# Patient Record
Sex: Female | Born: 2000
Health system: Southern US, Community
[De-identification: ages and names within clinical notes are randomized; demographics above are authoritative.]

## PROBLEM LIST (undated history)

## (undated) DIAGNOSIS — J45909 Unspecified asthma, uncomplicated: Secondary | ICD-10-CM

---
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
## 2020-04-20 ENCOUNTER — Other Ambulatory Visit: Payer: Self-pay | Admitting: Psychiatry

## 2020-04-20 DIAGNOSIS — F4010 Social phobia, unspecified: Secondary | ICD-10-CM

## 2020-04-20 DIAGNOSIS — F422 Mixed obsessional thoughts and acts: Secondary | ICD-10-CM

## 2020-04-20 DIAGNOSIS — F341 Dysthymic disorder: Secondary | ICD-10-CM

## 2020-04-23 NOTE — Telephone Encounter (Signed)
Last apt 06/2019 due back at 7 months

## 2020-05-20 ENCOUNTER — Other Ambulatory Visit: Payer: Self-pay | Admitting: Psychiatry

## 2020-05-20 DIAGNOSIS — F4010 Social phobia, unspecified: Secondary | ICD-10-CM

## 2020-05-20 DIAGNOSIS — F341 Dysthymic disorder: Secondary | ICD-10-CM

## 2020-05-20 DIAGNOSIS — F422 Mixed obsessional thoughts and acts: Secondary | ICD-10-CM

## 2020-05-21 NOTE — Telephone Encounter (Signed)
Last apt 11/220  Adm:  Please get patient scheduled for apt

## 2020-06-04 ENCOUNTER — Encounter: Payer: Self-pay | Admitting: Psychiatry

## 2020-06-19 ENCOUNTER — Other Ambulatory Visit: Payer: Self-pay | Admitting: Psychiatry

## 2020-06-19 DIAGNOSIS — F4010 Social phobia, unspecified: Secondary | ICD-10-CM

## 2020-06-19 DIAGNOSIS — F422 Mixed obsessional thoughts and acts: Secondary | ICD-10-CM

## 2020-06-19 DIAGNOSIS — F341 Dysthymic disorder: Secondary | ICD-10-CM

## 2020-06-24 ENCOUNTER — Telehealth: Payer: Self-pay | Admitting: Psychiatry

## 2020-06-24 ENCOUNTER — Encounter: Payer: Self-pay | Admitting: Psychiatry

## 2020-06-24 ENCOUNTER — Telehealth (INDEPENDENT_AMBULATORY_CARE_PROVIDER_SITE_OTHER): Payer: 59 | Admitting: Psychiatry

## 2020-06-24 DIAGNOSIS — H9325 Central auditory processing disorder: Secondary | ICD-10-CM | POA: Diagnosis not present

## 2020-06-24 DIAGNOSIS — F341 Dysthymic disorder: Secondary | ICD-10-CM

## 2020-06-24 DIAGNOSIS — F4010 Social phobia, unspecified: Secondary | ICD-10-CM

## 2020-06-24 DIAGNOSIS — F422 Mixed obsessional thoughts and acts: Secondary | ICD-10-CM | POA: Diagnosis not present

## 2020-06-24 MED ORDER — ESCITALOPRAM OXALATE 10 MG PO TABS: 30.0000 mg | ORAL_TABLET | Freq: Every day | ORAL | 3 refills | Status: DC

## 2020-06-24 MED ORDER — LAMOTRIGINE 100 MG PO TABS: 100.0000 mg | ORAL_TABLET | Freq: Every day | ORAL | 3 refills | Status: DC

## 2020-06-24 NOTE — Telephone Encounter (Signed)
Ms. Rhonda Campos, Rhonda Campos are scheduled for a virtual visit with your provider today.    Just as we do with appointments in the office, we must obtain your consent to participate.  Your consent will be active for this visit and any virtual visit you may have with one of our providers in the next 365 days.    If you have a MyChart account, I can also send a copy of this consent to you electronically.  All virtual visits are billed to your insurance company just like a traditional visit in the office.  As this is a virtual visit, video technology does not allow for your provider to perform a traditional examination.  This may limit your provider's ability to fully assess your condition.  If your provider identifies any concerns that need to be evaluated in person or the need to arrange testing such as labs, EKG, etc, we will make arrangements to do so.    Although advances in technology are sophisticated, we cannot ensure that it will always work on either your end or our end.  If the connection with a video visit is poor, we may have to switch to a telephone visit.  With either a video or telephone visit, we are not always able to ensure that we have a secure connection.   I need to obtain your verbal consent now.   Are you willing to proceed with your visit today?   Rhonda Campos has provided verbal consent on 06/24/2020 for a virtual visit (video or telephone).   Chauncey Mann, MD 06/24/2020  2:08 PM

## 2020-06-24 NOTE — Progress Notes (Signed)
[facility]  Patient ID: Rhonda Campos,  MRN: [id number]  PCP: Maeola Harman, MD  Date of Evaluation: 06/24/2020 Time spent:20 minutes from 1340 to 1400  Chief Complaint:  Chief Complaint    Anxiety; Stress; Depression      HISTORY/CURRENT STATUS: Rhonda Campos is provided telemedicine audiovisual session on MyChart Video Visit platform individually 20 minutes with privacy with telehealth consent with epic collateral video to video for psychiatric interview and exam in 1 year evaluation and management of OCD, atypical dysthymia, and history of social anxiety with CAPD.  Patient had 8 months of medication from last appointment stating she never ran out completely but did run low on her supply and was finally willing to make an appointment.  She completed high school and is now a freshman at [employer] with grades A's and B's taking 17 hours living in the dorm.  She indicates that she had most of her problems addressed while living with parents who do visit and have a good relationship with her.  She concludes the need to continue her medication overall as successful socially and academically.  She has no interim decompensations or medical illness reported, though she does have an IUD now and states she may use some party related alcohol not acknowledging cannabis currently.  She does acknowledge the need to continue her medication and agrees to yearly follow-up.  She understands my imminent retirement and closure of care to transfer to advanced practitioner transition facilitated by reception today at the closure of the appointment.  She has no mania, suicidality, psychosis or delirium.  Depression             The patient presents withdepression as a chronicproblem starting more than 9 years ago. The onset quality was gradual. The problem occurs every several days.The problem has been gradually improvingsince onset.Associated symptoms include decreased  concentration,oversensitivity,social inhibition, obsessional thoughts and acts, decreased interest,and reactive sadness. Associated symptoms include no fatigue,no insomnia, no helplessness,no hopelessness,no irritability, no appetite change,noheadaches,no indigestion, no myalgiasand no suicidal ideas.The symptoms are aggravated by social issues, family issues and work stress.Past treatments include SSRIs - Selective serotonin reuptake inhibitors, other medications and psychotherapy.Compliance with treatment is variable.Past compliance problems include medication issues and difficulty with treatment plan.Previous treatment provided moderaterelief.Risk factors include a change in medication usage/dosage, family history, family history of mental illness, family violence, history of mental illness, major life event and stress. Past medical history includes anxiety,depression,mental health disorderand obsessive-compulsive disorder. Pertinent negatives include no life-threatening condition,no recent psychiatric admission,no bipolar disorder,no eating disorder,no post-traumatic stress disorder,no schizophrenia,no suicide attemptsand no head trauma.  Individual Medical History/ Review of Systems: Changes? :No   Allergies: Red dye  Current Medications:  Current Outpatient Medications:  .  albuterol (PROVENTIL HFA;VENTOLIN HFA) 108 (90 BASE) MCG/ACT inhaler, Inhale into the lungs every 6 (six) hours as needed for wheezing or shortness of breath., Disp: , Rfl:  .  escitalopram (LEXAPRO) 10 MG tablet, Take 3 tablets (30 mg total) by mouth at bedtime., Disp: 270 tablet, Rfl: 3 .  lamoTRIgine (LAMICTAL) 100 MG tablet, Take 1 tablet (100 mg total) by mouth at bedtime., Disp: 90 tablet, Rfl: 3 .  mupirocin ointment (BACTROBAN) 2 %, Apply 1 application topically 2 (two) times daily. To ear, Disp: 30 g, Rfl: 1 .  sulfamethoxazole-trimethoprim (SEPTRA DS) 800-160 MG per tablet,  Take 1 tablet by mouth 2 (two) times daily., Disp: 14 tablet, Rfl: 0  Medication Side Effects: none  Family Medical/ Social History: Changes? No previously noting father  with bipolar and obsessive-compulsive disorder having TBI at age 52 years. Mother may have ADHD and anxiety symptoms. Both parents did poorly on Zoloft and mother did poorly on Cymbalta and Lexapro. Maternal uncle has bipolar disorder as does the uncle's mother who also had psychotic and substance use features.  MENTAL HEALTH EXAM:  There were no vitals taken for this visit.There is no height or weight on file to calculate BMI. Postural reflexes and gait 0/0, and AIMS = 0.  General Appearance: Casual, Meticulous and Well Groomed  Eye Contact:  Good  Speech:  Clear and Coherent, Normal Rate and Talkative  Volume:  Normal  Mood:  Anxious and Euthymic  Affect:  Congruent, Full Range and Anxious  Thought Process:  Coherent, Goal Directed and Descriptions of Associations: Circumstantial  Orientation:  Full (Time, Place, and Person)  Thought Content: Obsessions and Rumination   Suicidal Thoughts:  No  Homicidal Thoughts:  No  Memory:  Immediate;   Good Remote;   Good  Judgement:  Fair  Insight:  Good  Psychomotor Activity:  Normal and Mannerisms  Concentration:  Concentration: Fair and Attention Span: Good  Recall:  Good  Fund of Knowledge: Good  Language: Good  Assets:  Desire for Improvement Resilience Talents/Skills Vocational/Educational  ADL's:  Intact  Cognition: WNL  Prognosis:  Good    DIAGNOSES:    ICD-10-CM   1. Mixed obsessional thoughts and acts  F42.2 escitalopram (LEXAPRO) 10 MG tablet  2. Moderate early onset persistent depressive disorder in partial remission with atypical features and pure persistent depressive syndrome  F34.1 lamoTRIgine (LAMICTAL) 100 MG tablet    escitalopram (LEXAPRO) 10 MG tablet  3. Social anxiety disorder  F40.10 escitalopram (LEXAPRO) 10 MG tablet  4. Central auditory  processing disorder  H93.25     Receiving Psychotherapy: No    RECOMMENDATIONS: Support and education are provided regarding medication compliance outcome and management especially relative to medication dosing options currently organized most around family history and diagnoses.  Prevention and monitoring safety hygiene are updated.  She is E scribed Lexapro 10 mg taking 3 tablets total 30 mg every bedtime sent as #270 with 3 refills to [facility] for OCD, atypical dysthymia, and social anxiety disorder.  Lamictal 100 mg every bedtime is E scribed #90 with 3 refills to [facility] for major depression with atypical features and strong family history of bipolar disorder.  Case closure is finalized to transfer transition to Corie Chiquito, PMHNP to 1 follow-up in 1 year or sooner if needed.  Virtual Visit via Video Note  I connected with Elease Hashimoto on 06/24/20 at  1:40 PM EST by a video enabled telemedicine application and verified that I am speaking with the correct person using two identifiers.  Location: Patient: Individually with privacy in dorm room at [facility] video to video  Provider: [facility]   I discussed the limitations of evaluation and management by telemedicine and the availability of in person appointments. The patient expressed understanding and agreed to proceed.  History of Present Illness: 1 year evaluation and management address OCD, atypical dysthymia, and history of social anxiety with CAPD.  Patient had 8 months of medication from last appointment stating she never ran out completely but did run low on her supply and was finally willing to make an appointment.     Observations/Objective: Mood:  Anxious and Euthymic  Affect:  Congruent, Full Range and Anxious  Thought Process:  Coherent, Goal Directed and Descriptions of  Associations: Circumstantial  Orientation:  Full (Time, Place, and Person)  Thought  Content: Obsessions and Rumination    Assessment and Plan: Support and education are provided regarding medication compliance outcome and management especially relative to medication dosing options currently organized most around family history and diagnoses.  Prevention and monitoring safety hygiene are updated.  She is E scribed Lexapro 10 mg taking 3 tablets total 30 mg every bedtime sent as #270 with 3 refills to [facility] for OCD, atypical dysthymia, and social anxiety disorder.  Lamictal 100 mg every bedtime is E scribed #90 with 3 refills to [facility] for major depression with atypical features and strong family history of bipolar disorder.  Follow Up Instructions: Case closure is finalized to transfer transition to Corie Chiquito, PMHNP to 1 follow-up in 1 year or sooner if needed.     I discussed the assessment and treatment plan with the patient. The patient was provided an opportunity to ask questions and all were answered. The patient agreed with the plan and demonstrated an understanding of the instructions.   The patient was advised to call back or seek an in-person evaluation if the symptoms worsen or if the condition fails to improve as anticipated.  I provided 20 minutes of non-face-to-face time during this encounter. [email or web address] [phone]  Chauncey Mann, MD  Chauncey Mann, MD

## 2021-05-16 ENCOUNTER — Other Ambulatory Visit: Payer: Self-pay

## 2021-05-16 ENCOUNTER — Encounter: Payer: Self-pay | Admitting: Behavioral Health

## 2021-05-16 ENCOUNTER — Ambulatory Visit: Payer: BC Managed Care – PPO | Admitting: Behavioral Health

## 2021-05-16 VITALS — BP 109/76 | HR 90 | Ht 65.0 in | Wt 153.0 lb

## 2021-05-16 DIAGNOSIS — F341 Dysthymic disorder: Secondary | ICD-10-CM | POA: Diagnosis not present

## 2021-05-16 DIAGNOSIS — H9325 Central auditory processing disorder: Secondary | ICD-10-CM

## 2021-05-16 DIAGNOSIS — F4010 Social phobia, unspecified: Secondary | ICD-10-CM | POA: Diagnosis not present

## 2021-05-16 DIAGNOSIS — F422 Mixed obsessional thoughts and acts: Secondary | ICD-10-CM

## 2021-05-16 MED ORDER — LAMOTRIGINE 100 MG PO TABS: 100.0000 mg | ORAL_TABLET | Freq: Every day | ORAL | 3 refills | Status: DC

## 2021-05-16 MED ORDER — ESCITALOPRAM OXALATE 10 MG PO TABS: 30.0000 mg | ORAL_TABLET | Freq: Every day | ORAL | 3 refills | Status: DC

## 2021-05-16 NOTE — Progress Notes (Signed)
[facility]  Patient ID: Rhonda Campos,  MRN: [id number]  PCP: Maeola Harman, MD  Date of Evaluation: 05/16/2021 Time spent:40 minutes  Chief Complaint:  Chief Complaint   Anxiety; Follow-up; Medication Refill; attention problem     HISTORY/CURRENT STATUS: HPI 20 year old female presents to this office for follow up and medication refills. She says that she is current sophomore at [employer]. Says that she is former patient of Dr. Beverly Milch retired and that she is here for regular med check and refills. She is happy with current medication regimen of Lexapro and Lamictal. No medication changes are indicated at this time. She does wonder about having ADHD due to lack of concentration and focus. Both of her siblings have been diagnosed. She would like to get psych testing soon. She says her anxiety today is 3/10 and depression is 2/10. She  does sleep 7-8 hours per night. She endorses daily Cannabis use. She denies mania, no psychosis. No SI/HI.  She would like to follow up in 6 months.   Past medication trial: Prozac    Individual Medical History/ Review of Systems: Changes? :No   Allergies: Red dye  Current Medications:  Current Outpatient Medications:    albuterol (PROVENTIL HFA;VENTOLIN HFA) 108 (90 BASE) MCG/ACT inhaler, Inhale into the lungs every 6 (six) hours as needed for wheezing or shortness of breath., Disp: , Rfl:    escitalopram (LEXAPRO) 10 MG tablet, Take 3 tablets (30 mg total) by mouth at bedtime., Disp: 270 tablet, Rfl: 3   lamoTRIgine (LAMICTAL) 100 MG tablet, Take 1 tablet (100 mg total) by mouth at bedtime., Disp: 90 tablet, Rfl: 3   mupirocin ointment (BACTROBAN) 2 %, Apply 1 application topically 2 (two) times daily. To ear (Patient not taking: Reported on 05/16/2021), Disp: 30 g, Rfl: 1   sulfamethoxazole-trimethoprim (SEPTRA DS) 800-160 MG per tablet, Take 1 tablet by mouth 2 (two) times daily. (Patient not taking: Reported  on 05/16/2021), Disp: 14 tablet, Rfl: 0 Medication Side Effects: none  Family Medical/ Social History: Changes? No  MENTAL HEALTH EXAM:  Blood pressure 109/76, pulse 90, height 5\' 5"  (1.651 m), weight 153 lb (69.4 kg).Body mass index is 25.46 kg/m.  General Appearance: Casual, Neat, and Well Groomed  Eye Contact:  Good  Speech:  Clear and Coherent  Volume:  Normal  Mood:  NA  Affect:  Appropriate  Thought Process:  Coherent  Orientation:  Full (Time, Place, and Person)  Thought Content: Logical   Suicidal Thoughts:  No  Homicidal Thoughts:  No  Memory:  WNL  Judgement:  Good  Insight:  Good  Psychomotor Activity:  Normal  Concentration:  Concentration: Good  Recall:  Good  Fund of Knowledge: Good  Language: Good  Assets:  Desire for Improvement  ADL's:  Intact  Cognition: WNL  Prognosis:  Good    DIAGNOSES:    ICD-10-CM   1. Mixed obsessional thoughts and acts  F42.2 escitalopram (LEXAPRO) 10 MG tablet    2. Moderate early onset persistent depressive disorder in partial remission with atypical features and pure persistent depressive syndrome  F34.1 escitalopram (LEXAPRO) 10 MG tablet    lamoTRIgine (LAMICTAL) 100 MG tablet    3. Social anxiety disorder  F40.10 escitalopram (LEXAPRO) 10 MG tablet    4. Central auditory processing disorder  H93.25       Receiving Psychotherapy: No    RECOMMENDATIONS:   Greater than 50% of  40 min. face to face time with patient  was spent on counseling and coordination of care. We discussed long history of anxiety and depression. Discussed her prior tx with Dr. Marlyne Beards. She is happy with current medications. She does want to explore ADHD next visit. She has two siblings with ADHD. Strongly encouraged her to get psych testing and she said she was going to follow up with [place] [facility] soon and then reschedule appointment with this office.    She is E scribed Lexapro 10 mg taking 3 tablets total 30 mg every bedtime sent  as #270 with 3 refills to [facility] and [facility]. OCD, atypical dysthymia, and social anxiety disorder.  Lamictal 100 mg every bedtime is E scribed #90 with 3 refills to [facility] for major depression with atypical features and strong family history of bipolar disorder.   She will follow up in 6 months to reassess Provided emergency contact information Reviewed PDMP  Joan Flores, NP

## 2021-06-02 ENCOUNTER — Telehealth: Payer: Self-pay | Admitting: Behavioral Health

## 2021-06-02 NOTE — Telephone Encounter (Signed)
Pt called to remind Rhonda Campos about doing a letter for her for an emotional support animal.

## 2021-06-02 NOTE — Telephone Encounter (Signed)
Please review

## 2021-06-13 ENCOUNTER — Telehealth: Payer: Self-pay | Admitting: Behavioral Health

## 2021-06-13 NOTE — Telephone Encounter (Signed)
Pt called checking the status of her letter for her emotional support animal. Please call her at [phone]

## 2021-06-13 NOTE — Telephone Encounter (Signed)
Arlys John, Touch base with me Monday to discuss.

## 2021-06-18 NOTE — Telephone Encounter (Signed)
Spoke with pt and we discussed her letter, I took all her information for [place] to mail her a letter.   She has moved into a pet-friendly school apartment building but to avoid the pet fees she needs a letter. She just got her dog, Seward Grater, she is 30 months old and a pit bull terrier.   Her apartment building is called  [facility] [address] [place], [place].  Pt asked if we could e-mail to her and I informed her that probably couldn't happen, so she asked to be mailed to her.  Her address is  [address] [place], [place] [address]   Pt's e-mail in case we need it is [email or web address]  I have a template to use if needed.

## 2021-06-19 NOTE — Telephone Encounter (Signed)
Finally finished this letter. Its in admin box to mail. Hope this suffices.

## 2021-06-20 ENCOUNTER — Telehealth: Payer: Self-pay | Admitting: Behavioral Health

## 2021-06-20 NOTE — Telephone Encounter (Signed)
Letter mailed to Pt from [facility].

## 2021-11-14 ENCOUNTER — Ambulatory Visit: Payer: BC Managed Care – PPO | Admitting: Behavioral Health

## 2021-11-28 ENCOUNTER — Ambulatory Visit: Payer: BC Managed Care – PPO | Admitting: Behavioral Health

## 2021-11-28 ENCOUNTER — Encounter: Payer: Self-pay | Admitting: Behavioral Health

## 2021-11-28 DIAGNOSIS — F422 Mixed obsessional thoughts and acts: Secondary | ICD-10-CM

## 2021-11-28 DIAGNOSIS — F4010 Social phobia, unspecified: Secondary | ICD-10-CM

## 2021-11-28 DIAGNOSIS — F341 Dysthymic disorder: Secondary | ICD-10-CM

## 2021-11-28 NOTE — Progress Notes (Signed)
[facility] ? ?Patient ID: Rhonda Campos,  ?MRN: [id number] ? ?PCP: Maeola Harman, MD ? ?Date of Evaluation: 11/28/2021 ?Time spent:30 minutes ? ?Chief Complaint:  ?Chief Complaint   ?Anxiety; Depression; Follow-up; Medication Problem ?  ? ? ?HISTORY/CURRENT STATUS: ?HPI ? ?21 year old female presents to this office for follow up and medication refills. She would like to start reducing her medications. She fill like she has changed over time and would like to reduce or stop. She also would like to get screened for ADHD. She contacted [facility] but month weight list.  Both of her siblings have been diagnosed. She would like to get psych testing soon. She says her anxiety today is 3/10 and depression is 2/10. She  does sleep 7-8 hours per night. She endorses daily Cannabis use. She denies mania, no psychosis. No SI/HI.  She would like to follow up in 6 months.  ?  ?Past medication trial: ?Prozac  ?  ? ?Individual Medical History/ Review of Systems: Changes? :No  ? ?Allergies: Red dye ? ?Current Medications:  ?Current Outpatient Medications:  ?  albuterol (PROVENTIL HFA;VENTOLIN HFA) 108 (90 BASE) MCG/ACT inhaler, Inhale into the lungs every 6 (six) hours as needed for wheezing or shortness of breath., Disp: , Rfl:  ?  escitalopram (LEXAPRO) 10 MG tablet, Take 3 tablets (30 mg total) by mouth at bedtime., Disp: 270 tablet, Rfl: 3 ?  lamoTRIgine (LAMICTAL) 100 MG tablet, Take 1 tablet (100 mg total) by mouth at bedtime., Disp: 90 tablet, Rfl: 3 ?  mupirocin ointment (BACTROBAN) 2 %, Apply 1 application topically 2 (two) times daily. To ear (Patient not taking: Reported on 05/16/2021), Disp: 30 g, Rfl: 1 ?  sulfamethoxazole-trimethoprim (SEPTRA DS) 800-160 MG per tablet, Take 1 tablet by mouth 2 (two) times daily. (Patient not taking: Reported on 05/16/2021), Disp: 14 tablet, Rfl: 0 ?Medication Side Effects: none ? ?Family Medical/ Social History: Changes? No ? ?MENTAL HEALTH  EXAM: ? ?There were no vitals taken for this visit.There is no height or weight on file to calculate BMI.  ?General Appearance: Casual, Neat, and Well Groomed  ?Eye Contact:  Good  ?Speech:  Clear and Coherent  ?Volume:  Normal  ?Mood:  NA  ?Affect:  Appropriate  ?Thought Process:  Coherent  ?Orientation:  Full (Time, Place, and Person)  ?Thought Content: Logical   ?Suicidal Thoughts:  No  ?Homicidal Thoughts:  No  ?Memory:  WNL  ?Judgement:  Good  ?Insight:  Good  ?Psychomotor Activity:  Normal  ?Concentration:  Concentration: Good  ?Recall:  Good  ?Fund of Knowledge: Good  ?Language: Good  ?Assets:  Desire for Improvement  ?ADL's:  Intact  ?Cognition: WNL  ?Prognosis:  Good  ? ? ?DIAGNOSES:  ?  ICD-10-CM   ?1. Moderate early onset persistent depressive disorder in partial remission with atypical features and pure persistent depressive syndrome  F34.1   ?  ?2. Mixed obsessional thoughts and acts  F42.2   ?  ?3. Social anxiety disorder  F40.10   ?  ? ? ?Receiving Psychotherapy: No  ? ? ?RECOMMENDATIONS:  ? ?Greater than 50% of  20 min. face to face time with patient was spent on counseling and coordination of care. Discussed her desire to try to reduce of stop her medications, she believes that she may have grown out of some of her previous problems. She also would like to be screened for ADHD next visit.  ?  To reduce Lexapro to 20 mg daily. ?  To Reduce Lamictal down to 50 mg daily. ?She will follow up in 6 weeks to reassess ?Provided emergency contact information ?Reviewed PDMP ? ? ? ? ?Joan Flores, NP  ?

## 2022-01-02 ENCOUNTER — Encounter: Payer: Self-pay | Admitting: Behavioral Health

## 2022-01-02 ENCOUNTER — Ambulatory Visit: Payer: BC Managed Care – PPO | Admitting: Behavioral Health

## 2022-01-02 DIAGNOSIS — H9325 Central auditory processing disorder: Secondary | ICD-10-CM | POA: Diagnosis not present

## 2022-01-02 DIAGNOSIS — F4010 Social phobia, unspecified: Secondary | ICD-10-CM

## 2022-01-02 DIAGNOSIS — F422 Mixed obsessional thoughts and acts: Secondary | ICD-10-CM | POA: Diagnosis not present

## 2022-01-02 DIAGNOSIS — F900 Attention-deficit hyperactivity disorder, predominantly inattentive type: Secondary | ICD-10-CM

## 2022-01-02 DIAGNOSIS — F341 Dysthymic disorder: Secondary | ICD-10-CM

## 2022-01-02 MED ORDER — METHYLPHENIDATE HCL ER (OSM) 18 MG PO TBCR: 18.0000 mg | EXTENDED_RELEASE_TABLET | Freq: Every day | ORAL | 0 refills | Status: DC

## 2022-01-02 NOTE — Progress Notes (Signed)
[facility]  Patient ID: Rhonda Campos,  MRN: [id number]  PCP: Dene Gentry, MD  Date of Evaluation: 01/02/2022 Time spent:30 minutes  Chief Complaint:  Chief Complaint   ADHD; Anxiety; Depression; Follow-up     HISTORY/CURRENT STATUS: HPI  21  year old female presents to this office for follow up and medication and medication management. She says that she is going great since reducing her Celexa and Lamictal. She still struggles with attention and focus and would like to continue with screening. Says that she can never complete a task without moving on to another. She said the other day, she was in the middle of cooking some eggs and went to check on laundry, burning the eggs. She is very fidgety with constant foot tapping in both feet bilaterally. Says that in school she struggle with test taking because every single sound distracts her such as closing or opening pens, or someone clearing their throat. Says she feels that if she continues on this course her grades will suffer in school.  She is also waitress over the summer and says that she constantly forgets what she is doing when serving customer. Multitasking is difficult. She contacted [place] [facility] but month weight list.  Both of her siblings have been diagnosed. She would like to get psych testing soon. She says her anxiety today is 3/10 and depression is 2/10. She  does sleep 7-8 hours per night. She endorses daily Cannabis use. She denies mania, no psychosis. No SI/HI.  She would like to follow up in 6 months.    Past medication trial: Prozac    Individual Medical History/ Review of Systems: Changes? :No   Allergies: Red dye as child of 81 years of age. No significant problems since.   Current Medications:  Current Outpatient Medications:    methylphenidate (CONCERTA) 18 MG PO CR tablet, Take 1 tablet (18 mg total) by mouth daily., Disp: 30 tablet, Rfl: 0   albuterol (PROVENTIL HFA;VENTOLIN  HFA) 108 (90 BASE) MCG/ACT inhaler, Inhale into the lungs every 6 (six) hours as needed for wheezing or shortness of breath., Disp: , Rfl:    escitalopram (LEXAPRO) 10 MG tablet, Take 3 tablets (30 mg total) by mouth at bedtime., Disp: 270 tablet, Rfl: 3   lamoTRIgine (LAMICTAL) 100 MG tablet, Take 1 tablet (100 mg total) by mouth at bedtime., Disp: 90 tablet, Rfl: 3   mupirocin ointment (BACTROBAN) 2 %, Apply 1 application topically 2 (two) times daily. To ear (Patient not taking: Reported on 05/16/2021), Disp: 30 g, Rfl: 1   sulfamethoxazole-trimethoprim (SEPTRA DS) 800-160 MG per tablet, Take 1 tablet by mouth 2 (two) times daily. (Patient not taking: Reported on 05/16/2021), Disp: 14 tablet, Rfl: 0 Medication Side Effects: anxiety  Family Medical/ Social History: Changes? No  MENTAL HEALTH EXAM:  There were no vitals taken for this visit.There is no height or weight on file to calculate BMI.  General Appearance: Casual, Neat, and Well Groomed  Eye Contact:  Good  Speech:  Clear and Coherent  Volume:  Normal  Mood:  Angry  Affect:  Appropriate  Thought Process:  Coherent  Orientation:  Full (Time, Place, and Person)  Thought Content: Logical   Suicidal Thoughts:  No  Homicidal Thoughts:  No  Memory:  WNL  Judgement:  Good  Insight:  Good  Psychomotor Activity:  Normal  Concentration:  Concentration: Good  Recall:  Good  Fund of Knowledge: Good  Language: Good  Assets:  Desire for Improvement  ADL's:  Intact  Cognition: WNL  Prognosis:  Good    DIAGNOSES:    ICD-10-CM   1. Moderate early onset persistent depressive disorder in partial remission with atypical features and pure persistent depressive syndrome  F34.1     2. Mixed obsessional thoughts and acts  F42.2     3. Social anxiety disorder  F40.10     4. Central auditory processing disorder  H93.25     5. Attention deficit hyperactivity disorder (ADHD), predominantly inattentive type  F90.0 methylphenidate (CONCERTA)  18 MG PO CR tablet      Receiving Psychotherapy: No    RECOMMENDATIONS:   Greater than 50% of  30  min. face to face time with patient was spent on counseling and coordination of care. Discussed her desire to try to reduce of stop her medications, she believes that she may have grown out of some of her previous problems. She still struggle with attention and focus. We conducted the Adult Self Report Scale this visit.  Part A=23 Part B=27 These score highly indicate probability that she has ADHD. Scores were close suggesting combined hyperactive-inattentive.  I feel that a trial of appropriated medication could be useful in effecting quality of life.  Continue  Lexapro to 20 mg daily. Continue  Lamictal down to 50 mg daily. To start Concerta 18 mg daily in the am after breakfast. She will follow up in 6 weeks to reassess Provided emergency contact information Discussed potential benefits, risks, and side effects of stimulants with patient to include increased heart rate, palpitations, insomnia, increased anxiety, increased irritability, or decreased appetite.  Instructed patient to contact office if experiencing any significant tolerability issues.  Pt has IUD. Has sporadic short periods. [place] 2 months ago. Educated on risk of pregnancy while on these types of medications.  Reinforced that daily cannabis use could be complicating her treatment and suggested she stop or reduce frequency. Reviewed PDMP  Elwanda Brooklyn, NP

## 2022-01-21 ENCOUNTER — Other Ambulatory Visit: Payer: Self-pay | Admitting: Behavioral Health

## 2022-01-21 DIAGNOSIS — F900 Attention-deficit hyperactivity disorder, predominantly inattentive type: Secondary | ICD-10-CM

## 2022-01-21 MED ORDER — METHYLPHENIDATE HCL ER (OSM) 18 MG PO TBCR: 18.0000 mg | EXTENDED_RELEASE_TABLET | Freq: Every day | ORAL | 0 refills | Status: DC

## 2022-01-21 NOTE — Telephone Encounter (Signed)
Pt called and said that [facility] will not fill the concerta. Please send the methylphendiate to [facility]. They have spoken with them and told them they do have it in stock

## 2022-01-22 ENCOUNTER — Telehealth: Payer: Self-pay | Admitting: Behavioral Health

## 2022-01-22 ENCOUNTER — Other Ambulatory Visit: Payer: Self-pay | Admitting: Behavioral Health

## 2022-01-22 DIAGNOSIS — F900 Attention-deficit hyperactivity disorder, predominantly inattentive type: Secondary | ICD-10-CM

## 2022-01-22 MED ORDER — METHYLPHENIDATE HCL ER (OSM) 18 MG PO TBCR: 18.0000 mg | EXTENDED_RELEASE_TABLET | Freq: Every day | ORAL | 0 refills | Status: DC

## 2022-01-22 NOTE — Telephone Encounter (Signed)
[facility] mail order called to say that Methylphenidate is unavailable. They are asking for a substitute medication for pt.  Re# [id number]

## 2022-01-22 NOTE — Telephone Encounter (Signed)
Contact patient and inform them I had to send this script to local pharmacy. [facility] at [address]. I did not feel appropriate to switch at this time.

## 2022-01-22 NOTE — Telephone Encounter (Signed)
Pt informed

## 2022-01-22 NOTE — Telephone Encounter (Signed)
Please advise 

## 2022-01-27 ENCOUNTER — Other Ambulatory Visit: Payer: Self-pay | Admitting: Behavioral Health

## 2022-01-27 DIAGNOSIS — F900 Attention-deficit hyperactivity disorder, predominantly inattentive type: Secondary | ICD-10-CM

## 2022-01-27 MED ORDER — CONCERTA 18 MG PO TBCR: 18.0000 mg | EXTENDED_RELEASE_TABLET | Freq: Every day | ORAL | 0 refills | Status: DC

## 2022-01-27 NOTE — Telephone Encounter (Signed)
Pt called at 2 pm and said that no pharmacy has the generic concerta in stock. Please send in a script of the brand concerta. Then a PA will need to be done for insurance.

## 2022-01-28 ENCOUNTER — Other Ambulatory Visit: Payer: Self-pay | Admitting: Behavioral Health

## 2022-01-28 ENCOUNTER — Telehealth: Payer: Self-pay

## 2022-01-28 DIAGNOSIS — F4010 Social phobia, unspecified: Secondary | ICD-10-CM

## 2022-01-28 DIAGNOSIS — F341 Dysthymic disorder: Secondary | ICD-10-CM

## 2022-01-28 DIAGNOSIS — F900 Attention-deficit hyperactivity disorder, predominantly inattentive type: Secondary | ICD-10-CM

## 2022-01-28 DIAGNOSIS — H9325 Central auditory processing disorder: Secondary | ICD-10-CM

## 2022-01-28 DIAGNOSIS — F422 Mixed obsessional thoughts and acts: Secondary | ICD-10-CM

## 2022-01-28 MED ORDER — LISDEXAMFETAMINE DIMESYLATE 20 MG PO CAPS: 20.0000 mg | ORAL_CAPSULE | Freq: Every day | ORAL | 0 refills | Status: DC

## 2022-01-28 NOTE — Telephone Encounter (Signed)
These are covered formularies or medical reason she can not change to one listed?  amphetamine-dextroamphetamine mixed salts ext-rel, dexmethylphenidate ext-rel, methylphenidate ext-rel, AZSTARYS, JORNAY PM, MYDAYIS, VYVANSE

## 2022-01-28 NOTE — Telephone Encounter (Signed)
I sent Vyvanse 20 mg to [facility] at  [address], [address].

## 2022-01-28 NOTE — Telephone Encounter (Signed)
Thanks  Brian

## 2022-01-28 NOTE — Telephone Encounter (Signed)
I know the medication was switched to [facility] Concerta due to unavailability and I will finish submitting PA as initiated but unlikely insurance will cover change. Will update once response received

## 2022-01-29 ENCOUNTER — Ambulatory Visit: Payer: BC Managed Care – PPO | Admitting: Behavioral Health

## 2022-02-26 ENCOUNTER — Ambulatory Visit: Payer: BC Managed Care – PPO | Admitting: Behavioral Health

## 2022-02-26 ENCOUNTER — Encounter: Payer: Self-pay | Admitting: Behavioral Health

## 2022-02-26 DIAGNOSIS — F422 Mixed obsessional thoughts and acts: Secondary | ICD-10-CM

## 2022-02-26 DIAGNOSIS — F900 Attention-deficit hyperactivity disorder, predominantly inattentive type: Secondary | ICD-10-CM

## 2022-02-26 DIAGNOSIS — F4010 Social phobia, unspecified: Secondary | ICD-10-CM

## 2022-02-26 DIAGNOSIS — F341 Dysthymic disorder: Secondary | ICD-10-CM | POA: Diagnosis not present

## 2022-02-26 MED ORDER — LISDEXAMFETAMINE DIMESYLATE 20 MG PO CAPS: 20.0000 mg | ORAL_CAPSULE | Freq: Every day | ORAL | 0 refills | Status: DC

## 2022-02-26 MED ORDER — ESCITALOPRAM OXALATE 10 MG PO TABS: 30.0000 mg | ORAL_TABLET | Freq: Every day | ORAL | 3 refills | Status: DC

## 2022-02-26 MED ORDER — LAMOTRIGINE 100 MG PO TABS: 50.0000 mg | ORAL_TABLET | Freq: Every day | ORAL | 3 refills | Status: DC

## 2022-02-26 NOTE — Progress Notes (Signed)
[facility]  Patient ID: Rhonda Campos,  MRN: [id number]  PCP: Maeola Harman, MD  Date of Evaluation: 02/26/2022 Time spent:20 minutes  Chief Complaint:  Chief Complaint   Anxiety; Depression; ADHD; Follow-up; Medication Refill     HISTORY/CURRENT STATUS: HPI  21  year old female presents to this office for follow up and medication and medication management. She continue to doe well with reduced dose of Lamictal and Lexapro. Says Vyvanse is working very well for her attention and focus. Says her performance at work has significantly improved. She says her anxiety today is 1/10 and depression is 1/10. She  does sleep 7-8 hours per night. She endorses daily Cannabis use. She denies mania, no psychosis. No SI/HI.  She would like to follow up in 6 months.    Past medication trial: Prozac     Individual Medical History/ Review of Systems: Changes? :No   Allergies: Red dye  Current Medications:  Current Outpatient Medications:    albuterol (PROVENTIL HFA;VENTOLIN HFA) 108 (90 BASE) MCG/ACT inhaler, Inhale into the lungs every 6 (six) hours as needed for wheezing or shortness of breath., Disp: , Rfl:    CONCERTA 18 MG CR tablet, Take 1 tablet (18 mg total) by mouth daily., Disp: 30 tablet, Rfl: 0   escitalopram (LEXAPRO) 10 MG tablet, Take 3 tablets (30 mg total) by mouth at bedtime., Disp: 270 tablet, Rfl: 3   lamoTRIgine (LAMICTAL) 100 MG tablet, Take 0.5 tablets (50 mg total) by mouth at bedtime., Disp: 90 tablet, Rfl: 3   [START ON 02/27/2022] lisdexamfetamine (VYVANSE) 20 MG capsule, Take 1 capsule (20 mg total) by mouth daily., Disp: 30 capsule, Rfl: 0   mupirocin ointment (BACTROBAN) 2 %, Apply 1 application topically 2 (two) times daily. To ear (Patient not taking: Reported on 05/16/2021), Disp: 30 g, Rfl: 1   sulfamethoxazole-trimethoprim (SEPTRA DS) 800-160 MG per tablet, Take 1 tablet by mouth 2 (two) times daily. (Patient not taking: Reported on 05/16/2021),  Disp: 14 tablet, Rfl: 0 Medication Side Effects: none  Family Medical/ Social History: Changes? No  MENTAL HEALTH EXAM:  There were no vitals taken for this visit.There is no height or weight on file to calculate BMI.  General Appearance: Casual, Neat, and Well Groomed  Eye Contact:  Good  Speech:  Clear and Coherent  Volume:  Normal  Mood:  NA  Affect:  Appropriate  Thought Process:  Coherent  Orientation:  Full (Time, Place, and Person)  Thought Content: Logical   Suicidal Thoughts:  No  Homicidal Thoughts:  No  Memory:  WNL  Judgement:  Good  Insight:  Good  Psychomotor Activity:  Normal  Concentration:  Concentration: Good  Recall:  Good  Fund of Knowledge: Good  Language: Good  Assets:  Desire for Improvement  ADL's:  Intact  Cognition: WNL  Prognosis:  Good    DIAGNOSES:    ICD-10-CM   1. Attention deficit hyperactivity disorder (ADHD), predominantly inattentive type  F90.0 lisdexamfetamine (VYVANSE) 20 MG capsule    2. Mixed obsessional thoughts and acts  F42.2 escitalopram (LEXAPRO) 10 MG tablet    3. 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 Social anxiety disorder  F40.10 escitalopram (LEXAPRO) 10 MG tablet      Receiving Psychotherapy: No    RECOMMENDATIONS:   Greater than 50% of  30  min. face to face time with patient was spent on counseling and coordination of care. She says that she is doing very well since initiating Vyvanse. Her focus and attention at work has improved greatly. She is happy with the medication. He eye contact has improved this visit and is more focus with her speech.  Continue  Lexapro to 20 mg daily. Continue  Lamictal down to 50 mg daily. To continue Vyvanse 20 mg daily.  She will follow up in 3 months to reassess Provided emergency contact  information Discussed potential benefits, risks, and side effects of stimulants with patient to include increased heart rate, palpitations, insomnia, increased anxiety, increased irritability, or decreased appetite.  Instructed patient to contact office if experiencing any significant tolerability issues.  Pt has IUD. Has sporadic short periods.  Reinforced that daily cannabis use could be complicating her treatment and suggested she stop or reduce frequency. Reviewed PDMP      Joan Flores, NP

## 2022-03-10 ENCOUNTER — Telehealth: Payer: Self-pay | Admitting: Behavioral Health

## 2022-03-10 NOTE — Telephone Encounter (Signed)
Pt called requesting a new ESA letter addressed to her new apartment.[facility]. [address] [place] [address]. When ready, mail letter to pt home address [address] [place] [address]. Questions call pt @ [phone]

## 2022-03-12 NOTE — Telephone Encounter (Signed)
Letter completed and signed by Arlys John. Will give to admin staff to mail to pt.

## 2022-03-12 NOTE — Telephone Encounter (Signed)
Thank you for you help as always.

## 2022-03-12 NOTE — Telephone Encounter (Signed)
Pt notified and mailed.

## 2022-04-28 ENCOUNTER — Other Ambulatory Visit: Payer: Self-pay

## 2022-04-28 ENCOUNTER — Telehealth: Payer: Self-pay | Admitting: Behavioral Health

## 2022-04-28 DIAGNOSIS — F900 Attention-deficit hyperactivity disorder, predominantly inattentive type: Secondary | ICD-10-CM

## 2022-04-28 MED ORDER — LISDEXAMFETAMINE DIMESYLATE 20 MG PO CAPS: 20.0000 mg | ORAL_CAPSULE | Freq: Every day | ORAL | 0 refills | Status: DC

## 2022-04-28 NOTE — Telephone Encounter (Signed)
Pt requested Vyvanse refill to be sent to [facility] in [place].  Next appt 10/12

## 2022-04-28 NOTE — Telephone Encounter (Signed)
Pended.

## 2022-05-27 ENCOUNTER — Ambulatory Visit: Payer: BC Managed Care – PPO | Admitting: Behavioral Health

## 2022-05-28 ENCOUNTER — Encounter: Payer: Self-pay | Admitting: Behavioral Health

## 2022-05-28 ENCOUNTER — Ambulatory Visit (INDEPENDENT_AMBULATORY_CARE_PROVIDER_SITE_OTHER): Payer: BC Managed Care – PPO | Admitting: Behavioral Health

## 2022-05-28 DIAGNOSIS — F900 Attention-deficit hyperactivity disorder, predominantly inattentive type: Secondary | ICD-10-CM | POA: Diagnosis not present

## 2022-05-28 MED ORDER — LISDEXAMFETAMINE DIMESYLATE 20 MG PO CAPS: 20.0000 mg | ORAL_CAPSULE | Freq: Every day | ORAL | 0 refills | Status: DC

## 2022-05-28 NOTE — Progress Notes (Signed)
[name] [id number] 2001/08/13 21 y.o.  Virtual Visit via Telephone Note  I connected with pt on 05/28/22 at  1:30 PM EDT by telephone and verified that I am speaking with the correct person using two identifiers.   I discussed the limitations, risks, security and privacy concerns of performing an evaluation and management service by telephone and the availability of in person appointments. I also discussed with the patient that there may be a patient responsible charge related to this service. The patient expressed understanding and agreed to proceed.   I discussed the assessment and treatment plan with the patient. The patient was provided an opportunity to ask questions and all were answered. The patient agreed with the plan and demonstrated an understanding of the instructions.   The patient was advised to call back or seek an in-person evaluation if the symptoms worsen or if the condition fails to improve as anticipated.  I provided 20 minutes of non-face-to-face time during this encounter.  The patient was located at home.  The provider was located at [place].    Elwanda Brooklyn, NP   Subjective:   Patient ID:  Rhonda Campos is a 21 y.o. (DOB 2000-11-05) female.  Chief Complaint:  Chief Complaint  Patient presents with   ADHD   Depression   Anxiety   Medication Refill   Patient Education    HPI 21  year old female presents to this office for follow up and medication and medication management.  No changes this visit. She continue to do  well with reduced dose of Lamictal and Lexapro. Says Vyvanse is working very well for her attention and focus. Says her performance at work has significantly improved. She says her anxiety today is 1/10 and depression is 1/10. She  does sleep 7-8 hours per night. She endorses daily Cannabis use. She denies mania, no psychosis. No SI/HI.  She would like to follow up in 6 months.    Past medication trial: Prozac    Review  of Systems:  Review of Systems  Constitutional: Negative.   Allergic/Immunologic: Negative.   Neurological: Negative.   Psychiatric/Behavioral:  Positive for decreased concentration.     Medications: I have reviewed the patient's current medications.  Current Outpatient Medications  Medication Sig Dispense Refill   albuterol (PROVENTIL HFA;VENTOLIN HFA) 108 (90 BASE) MCG/ACT inhaler Inhale into the lungs every 6 (six) hours as needed for wheezing or shortness of breath.     CONCERTA 18 MG CR tablet Take 1 tablet (18 mg total) by mouth daily. 30 tablet 0   escitalopram (LEXAPRO) 10 MG tablet Take 3 tablets (30 mg total) by mouth at bedtime. 270 tablet 3   lamoTRIgine (LAMICTAL) 100 MG tablet Take 0.5 tablets (50 mg total) by mouth at bedtime. 90 tablet 3   [START ON 06/05/2022] lisdexamfetamine (VYVANSE) 20 MG capsule Take 1 capsule (20 mg total) by mouth daily. 30 capsule 0   mupirocin ointment (BACTROBAN) 2 % Apply 1 application topically 2 (two) times daily. To ear (Patient not taking: Reported on 05/16/2021) 30 g 1   sulfamethoxazole-trimethoprim (SEPTRA DS) 800-160 MG per tablet Take 1 tablet by mouth 2 (two) times daily. (Patient not taking: Reported on 05/16/2021) 14 tablet 0   No current facility-administered medications for this visit.    Medication Side Effects: None  Allergies:  Allergies  Allergen Reactions   Red Dye     Past Medical History:  Diagnosis Date   Asthma     Family History  Problem Relation Age of Onset   Alcohol abuse Father    ADD / ADHD Sister    Depression Sister    ADD / ADHD Brother    Depression Maternal Uncle    Anxiety disorder Maternal Uncle    Bipolar disorder Maternal Uncle    Anxiety disorder Maternal Grandmother    Depression Maternal Grandmother    Bipolar disorder Maternal Grandmother     Social History   Socioeconomic History   Marital status: Single    Spouse name: Not on file   Number of children: Not on file   Years of  education: 13   Highest education level: Some college, no degree  Occupational History   Occupation: Educational psychologist at [employer]   Occupation: Works at [employer] in [place] and [employer] on the weekends in [place]  Tobacco Use   Smoking status: Never   Smokeless tobacco: Never  Vaping Use   Vaping Use: Never used  Substance and Sexual Activity   Alcohol use: Yes    Comment: couple drinks week   Drug use: Not Currently    Types: Marijuana   Sexual activity: Yes    Birth control/protection: I.U.D.  Other Topics Concern   Not on file  Social History Narrative   Lives with mom and dad in [place]. Current Sophomore at [employer]. Major in Investment banker, corporate.  Like to paint in free time.    Social Determinants of Health   Financial Resource Strain: Not on file  Food Insecurity: Not on file  Transportation Needs: Not on file  Physical Activity: Not on file  Stress: Not on file  Social Connections: Not on file  Intimate Partner Violence: Not on file    Past Medical History, Surgical history, Social history, and Family history were reviewed and updated as appropriate.   Please see review of systems for further details on the patient's review from today.   Objective:   Physical Exam:  There were no vitals taken for this visit.  Physical Exam Neurological:     Mental Status: She is alert and oriented to person, place, and time.  Psychiatric:        Attention and Perception: Attention and perception normal.        Mood and Affect: Mood normal.        Speech: Speech normal.        Behavior: Behavior normal. Behavior is cooperative.        Cognition and Memory: Cognition and memory normal.        Judgment: Judgment normal.     Comments: Insight intact     Lab Review:  No results found for: "NA", "K", "CL", "CO2", "GLUCOSE", "BUN", "CREATININE", "CALCIUM", "PROT", "ALBUMIN", "AST", "ALT", "ALKPHOS", "BILITOT", "GFRNONAA", "GFRAA"     Component Value Date/Time   WBC 7.9  06/06/2007 2210   RBC 4.58 06/06/2007 2210   HGB 13.1 06/06/2007 2210   HCT 38.0 06/06/2007 2210   PLT 273 06/06/2007 2210   MCV 82.8 06/06/2007 2210   MCHC 34.5 (H) 06/06/2007 2210   RDW 12.7 06/06/2007 2210   LYMPHSABS 2.2 06/06/2007 2210   MONOABS 0.5 06/06/2007 2210   EOSABS 0.0 06/06/2007 2210   BASOSABS 0.1 06/06/2007 2210    No results found for: "POCLITH", "LITHIUM"   No results found for: "PHENYTOIN", "PHENOBARB", "VALPROATE", "CBMZ"   .res Assessment: Plan:    Greater than 50% of  20 min telephonic interview with patient was spent on counseling and coordination of care.  She says that she continues to do very well since initiating Vyvanse. Her focus and attention at work has improved greatly. She is happy with the medication. He eye contact has improved this visit and is more focus with her speech.  Continue  Lexapro to 20 mg daily. Continue  Lamictal down to 50 mg daily. To continue Vyvanse 20 mg daily.  She will follow up in 3 months to reassess Provided emergency contact information Discussed potential benefits, risks, and side effects of stimulants with patient to include increased heart rate, palpitations, insomnia, increased anxiety, increased irritability, or decreased appetite.  Instructed patient to contact office if experiencing any significant tolerability issues.  Pt has IUD. Has sporadic short periods.  Reinforced that daily cannabis use could be complicating her treatment and suggested she stop or reduce frequency. Reviewed PDMP           Rhonda Campos was seen today for adhd, depression, anxiety, medication refill and patient education.  Diagnoses and all orders for this visit:  Attention deficit hyperactivity disorder (ADHD), predominantly inattentive type -     lisdexamfetamine (VYVANSE) 20 MG capsule; Take 1 capsule (20 mg total) by mouth daily.    Please see After Visit Summary for patient specific instructions.  No future appointments.  No  orders of the defined types were placed in this encounter.     -------------------------------

## 2022-08-18 ENCOUNTER — Telehealth: Payer: Self-pay | Admitting: Behavioral Health

## 2022-08-18 ENCOUNTER — Other Ambulatory Visit: Payer: Self-pay

## 2022-08-18 DIAGNOSIS — F900 Attention-deficit hyperactivity disorder, predominantly inattentive type: Secondary | ICD-10-CM

## 2022-08-18 MED ORDER — LISDEXAMFETAMINE DIMESYLATE 20 MG PO CAPS: 20.0000 mg | ORAL_CAPSULE | Freq: Every day | ORAL | 0 refills | Status: DC

## 2022-08-18 NOTE — Telephone Encounter (Signed)
Pended.

## 2022-08-18 NOTE — Telephone Encounter (Signed)
Pt called in 2:47. She is requesting a RF on her Vyvanse 20mg . Please send to [facility] in [address] in [place], [place]

## 2022-08-27 ENCOUNTER — Telehealth: Payer: Self-pay | Admitting: Behavioral Health

## 2022-08-27 NOTE — Telephone Encounter (Signed)
PT lvm that she never got vyvanse  because of shortage. She is at school now and would like [facility] [place]

## 2022-08-28 ENCOUNTER — Other Ambulatory Visit: Payer: Self-pay

## 2022-08-28 DIAGNOSIS — F900 Attention-deficit hyperactivity disorder, predominantly inattentive type: Secondary | ICD-10-CM

## 2022-08-28 MED ORDER — LISDEXAMFETAMINE DIMESYLATE 20 MG PO CAPS: 20.0000 mg | ORAL_CAPSULE | Freq: Every day | ORAL | 0 refills | Status: DC

## 2022-08-28 NOTE — Telephone Encounter (Signed)
Pended.

## 2022-08-31 ENCOUNTER — Encounter: Payer: Self-pay | Admitting: Behavioral Health

## 2022-08-31 ENCOUNTER — Ambulatory Visit (INDEPENDENT_AMBULATORY_CARE_PROVIDER_SITE_OTHER): Payer: BC Managed Care – PPO | Admitting: Behavioral Health

## 2022-08-31 DIAGNOSIS — F341 Dysthymic disorder: Secondary | ICD-10-CM | POA: Diagnosis not present

## 2022-08-31 DIAGNOSIS — F422 Mixed obsessional thoughts and acts: Secondary | ICD-10-CM | POA: Diagnosis not present

## 2022-08-31 DIAGNOSIS — F900 Attention-deficit hyperactivity disorder, predominantly inattentive type: Secondary | ICD-10-CM | POA: Diagnosis not present

## 2022-08-31 MED ORDER — AMPHETAMINE-DEXTROAMPHET ER 15 MG PO CP24: 15.0000 mg | ORAL_CAPSULE | ORAL | 0 refills | Status: DC

## 2022-08-31 NOTE — Progress Notes (Signed)
Rhonda Campos [id number] April 27, 2001 22 y.o.  Virtual Visit via Telephone Note  I connected with pt on 08/31/22 at  1:00 PM EST by telephone and verified that I am speaking with the correct person using two identifiers.   I discussed the limitations, risks, security and privacy concerns of performing an evaluation and management service by telephone and the availability of in person appointments. I also discussed with the patient that there may be a patient responsible charge related to this service. The patient expressed understanding and agreed to proceed.   I discussed the assessment and treatment plan with the patient. The patient was provided an opportunity to ask questions and all were answered. The patient agreed with the plan and demonstrated an understanding of the instructions.   The patient was advised to call back or seek an in-person evaluation if the symptoms worsen or if the condition fails to improve as anticipated.  I provided 30 minutes of non-face-to-face time during this encounter.  The patient was located at home.  The provider was located at [place].   Elwanda Brooklyn, NP   Subjective:   Patient ID:  Rhonda Campos is a 22 y.o. (DOB 2000/12/05) female.  Chief Complaint:  Chief Complaint  Patient presents with   ADHD   Depression   Anxiety   Medication Problem   Follow-up   Medication Refill   Patient Education    HPI 22  year old female presents to this office via telephonic interview for follow up and medication and medication management.  She continues to do  well with reduced dose of Lamictal and Lexapro. Says she has not been able to get vyvanse due to [place] shortage. She would like to switch to Adderall because there has not been problems in the [place] area recently with this medication.  Says her performance at work has significantly improved. She says her anxiety today is 1/10 and depression is 1/10. She  does sleep 7-8 hours per night.  She endorses daily Cannabis use. She denies mania, no psychosis. No SI/HI.  She would like to follow up in 3 months.    Past medication trial: Prozac    Review of Systems:  Review of Systems  Constitutional: Negative.   Allergic/Immunologic: Negative.   Neurological: Negative.   Psychiatric/Behavioral:  Positive for decreased concentration.     Medications: I have reviewed the patient's current medications.  Current Outpatient Medications  Medication Sig Dispense Refill   amphetamine-dextroamphetamine (ADDERALL XR) 15 MG 24 hr capsule Take 1 capsule by mouth every morning. 30 capsule 0   albuterol (PROVENTIL HFA;VENTOLIN HFA) 108 (90 BASE) MCG/ACT inhaler Inhale into the lungs every 6 (six) hours as needed for wheezing or shortness of breath.     escitalopram (LEXAPRO) 10 MG tablet Take 3 tablets (30 mg total) by mouth at bedtime. 270 tablet 3   lamoTRIgine (LAMICTAL) 100 MG tablet Take 0.5 tablets (50 mg total) by mouth at bedtime. 90 tablet 3   mupirocin ointment (BACTROBAN) 2 % Apply 1 application topically 2 (two) times daily. To ear (Patient not taking: Reported on 05/16/2021) 30 g 1   sulfamethoxazole-trimethoprim (SEPTRA DS) 800-160 MG per tablet Take 1 tablet by mouth 2 (two) times daily. (Patient not taking: Reported on 05/16/2021) 14 tablet 0   No current facility-administered medications for this visit.    Medication Side Effects: None  Allergies:  Allergies  Allergen Reactions   Red Dye     Past Medical History:  Diagnosis Date  Asthma     Family History  Problem Relation Age of Onset   Alcohol abuse Father    ADD / ADHD Sister    Depression Sister    ADD / ADHD Brother    Depression Maternal Uncle    Anxiety disorder Maternal Uncle    Bipolar disorder Maternal Uncle    Anxiety disorder Maternal Grandmother    Depression Maternal Grandmother    Bipolar disorder Maternal Grandmother     Social History   Socioeconomic History   Marital status: Single     Spouse name: Not on file   Number of children: Not on file   Years of education: 13   Highest education level: Some college, no degree  Occupational History   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  Other Topics Concern   Not on file  Social History Narrative   Lives with mom and dad in [place]. Current Sophomore at [employer]. Major in Administrator, sports.  Like to paint in free time.    Social Determinants of Health   Financial Resource Strain: Not on file  Food Insecurity: Not on file  Transportation Needs: Not on file  Physical Activity: Not on file  Stress: Not on file  Social Connections: Not on file  Intimate Partner Violence: Not on file    Past Medical History, Surgical history, Social history, and Family history were reviewed and updated as appropriate.   Please see review of systems for further details on the patient's review from today.   Objective:   Physical Exam:  There were no vitals taken for this visit.  Physical Exam Neurological:     Mental Status: She is alert and oriented to person, place, and time.  Psychiatric:        Attention and Perception: Attention and perception normal.        Mood and Affect: Mood normal.        Speech: Speech normal.        Behavior: Behavior normal. Behavior is cooperative.        Cognition and Memory: Cognition and memory normal.        Judgment: Judgment normal.     Comments: Insight intact     Lab Review:  No results found for: "NA", "K", "CL", "CO2", "GLUCOSE", "BUN", "CREATININE", "CALCIUM", "PROT", "ALBUMIN", "AST", "ALT", "ALKPHOS",  "BILITOT", "GFRNONAA", "GFRAA"     Component Value Date/Time   WBC 7.9 06/06/2007 2210   RBC 4.58 06/06/2007 2210   HGB 13.1 06/06/2007 2210   HCT 38.0 06/06/2007 2210   PLT 273 06/06/2007 2210   MCV 82.8 06/06/2007 2210   MCHC 34.5 (H) 06/06/2007 2210   RDW 12.7 06/06/2007 2210   LYMPHSABS 2.2 06/06/2007 2210   MONOABS 0.5 06/06/2007 2210   EOSABS 0.0 06/06/2007 2210   BASOSABS 0.1 06/06/2007 2210    No results found for: "POCLITH", "LITHIUM"   No results found for: "PHENYTOIN", "PHENOBARB", "VALPROATE", "CBMZ"   .res Assessment: Plan:    Greater than 50% of  20 min telephonic interview with patient  was spent on counseling and coordination of care. She says that she continues to do very well since initiating Vyvanse but pharmacy never has the drug in stock. We discussed changing medication.  Her focus and attention at work has improved greatly. She is happy with the medication.  Continue  Lexapro to 20 mg daily. Continue  Lamictal down to 50 mg daily. Stop Vyvanse 20 mg daily.  To start Adderall 15 mg XR daily She will follow up in 3 months to reassess Provided emergency contact information Discussed potential benefits, risks, and side effects of stimulants with patient to include increased heart rate, palpitations, insomnia, increased anxiety, increased irritability, or decreased appetite.  Instructed patient to contact office if experiencing any significant tolerability issues.  Pt has IUD. Has sporadic short periods.  Reinforced that daily cannabis use could be complicating her treatment and suggested she stop or reduce frequency. Reviewed PDMP              Rhonda Campos was seen today for adhd, depression, anxiety, medication problem, follow-up, medication refill and patient education.  Diagnoses and all orders for this visit:  Attention deficit hyperactivity disorder (ADHD), predominantly inattentive type -     amphetamine-dextroamphetamine (ADDERALL XR) 15 MG 24 hr  capsule; Take 1 capsule by mouth every morning.  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  No future appointments.  No orders of the defined types were placed in this encounter.     -------------------------------

## 2022-09-03 ENCOUNTER — Other Ambulatory Visit: Payer: Self-pay | Admitting: Behavioral Health

## 2022-09-03 DIAGNOSIS — F900 Attention-deficit hyperactivity disorder, predominantly inattentive type: Secondary | ICD-10-CM

## 2022-09-03 MED ORDER — AMPHETAMINE-DEXTROAMPHET ER 15 MG PO CP24: 15.0000 mg | ORAL_CAPSULE | ORAL | 0 refills | Status: DC

## 2022-09-03 NOTE — Telephone Encounter (Signed)
[facility] l[facility] that [facility]'t [facility] in stock. She said that she needs it sent to the [facility] located at [address] they do have it in stock

## 2022-10-09 ENCOUNTER — Other Ambulatory Visit: Payer: Self-pay

## 2022-10-09 ENCOUNTER — Telehealth: Payer: Self-pay | Admitting: Behavioral Health

## 2022-10-09 DIAGNOSIS — F900 Attention-deficit hyperactivity disorder, predominantly inattentive type: Secondary | ICD-10-CM

## 2022-10-09 NOTE — Telephone Encounter (Signed)
Pended.

## 2022-10-09 NOTE — Telephone Encounter (Signed)
Pt called and wants refill of Adderall sent to  [id number] [place], [place] [address] [place] [address] Phone: [phone]  Fax: [phone]

## 2022-10-09 NOTE — Telephone Encounter (Signed)
Next appt 4/24

## 2022-10-12 MED ORDER — AMPHETAMINE-DEXTROAMPHET ER 15 MG PO CP24: 15.0000 mg | ORAL_CAPSULE | ORAL | 0 refills | Status: DC

## 2022-11-10 ENCOUNTER — Telehealth: Payer: Self-pay | Admitting: Behavioral Health

## 2022-11-10 NOTE — Telephone Encounter (Signed)
Next visit is 12/09/22. Rhonda Campos is requesting a refill on her Vyvanse and wants to know if if her Vyvanse can be increased to 20 mg/ phone number is [phone].

## 2022-11-11 NOTE — Telephone Encounter (Signed)
Sent a MyChart message .

## 2022-11-11 NOTE — Telephone Encounter (Signed)
Mailbox is full.

## 2022-11-12 ENCOUNTER — Other Ambulatory Visit: Payer: Self-pay

## 2022-11-12 DIAGNOSIS — F900 Attention-deficit hyperactivity disorder, predominantly inattentive type: Secondary | ICD-10-CM

## 2022-11-12 MED ORDER — AMPHETAMINE-DEXTROAMPHET ER 15 MG PO CP24: 15.0000 mg | ORAL_CAPSULE | ORAL | 0 refills | Status: DC

## 2022-11-12 NOTE — Telephone Encounter (Signed)
Pended Adderall, not Vyvanse.

## 2022-12-09 ENCOUNTER — Encounter: Payer: Self-pay | Admitting: Behavioral Health

## 2022-12-09 ENCOUNTER — Ambulatory Visit (INDEPENDENT_AMBULATORY_CARE_PROVIDER_SITE_OTHER): Payer: BC Managed Care – PPO | Admitting: Behavioral Health

## 2022-12-09 DIAGNOSIS — F341 Dysthymic disorder: Secondary | ICD-10-CM

## 2022-12-09 DIAGNOSIS — F900 Attention-deficit hyperactivity disorder, predominantly inattentive type: Secondary | ICD-10-CM

## 2022-12-09 DIAGNOSIS — F422 Mixed obsessional thoughts and acts: Secondary | ICD-10-CM

## 2022-12-09 NOTE — Progress Notes (Signed)
Rhonda Campos [id number] 12/24/2000 21 y.o.  Virtual Visit via Telephone Note  I connected with pt on 12/09/22 at 11:30 AM EDT by telephone and verified that I am speaking with the correct person using two identifiers.   I discussed the limitations, risks, security and privacy concerns of performing an evaluation and management service by telephone and the availability of in person appointments. I also discussed with the patient that there may be a patient responsible charge related to this service. The patient expressed understanding and agreed to proceed.   I discussed the assessment and treatment plan with the patient. The patient was provided an opportunity to ask questions and all were answered. The patient agreed with the plan and demonstrated an understanding of the instructions.   The patient was advised to call back or seek an in-person evaluation if the symptoms worsen or if the condition fails to improve as anticipated.  I provided 30 minutes of non-face-to-face time during this encounter.  The patient was located at home.  The provider was located at [facility].   Joan Flores, NP   Subjective:   Patient ID:  Rhonda Campos is a 22 y.o. (DOB 15-Aug-2001) female.  Chief Complaint:  Chief Complaint  Patient presents with   ADHD   Depression   Anxiety   Follow-up   Patient Education   Medication Refill   Medication Problem    HPI  22 year old female presents to this office via telephonic interview for follow up and medication and medication management.  She continues to do  well with reduced dose of Lamictal and Lexapro.  Says her performance at work has significantly improved overall but having some decline in the afternoon. She would like to try a slight dosage increase with the Adderall.  She says her anxiety today is 1/10 and depression is 1/10. She  does sleep 7-8 hours per night. She endorses daily Cannabis use. She denies mania, no psychosis. No SI/HI.   She would like to follow up in 3 months.    Past medication trial: Prozac   Review of Systems:  Review of Systems  Constitutional: Negative.   Allergic/Immunologic: Negative.   Neurological: Negative.   Psychiatric/Behavioral:  Positive for decreased concentration.     Medications: I have reviewed the patient's current medications.  Current Outpatient Medications  Medication Sig Dispense Refill   albuterol (PROVENTIL HFA;VENTOLIN HFA) 108 (90 BASE) MCG/ACT inhaler Inhale into the lungs every 6 (six) hours as needed for wheezing or shortness of breath.     amphetamine-dextroamphetamine (ADDERALL XR) 15 MG 24 hr capsule Take 1 capsule by mouth every morning. 30 capsule 0   escitalopram (LEXAPRO) 10 MG tablet Take 3 tablets (30 mg total) by mouth at bedtime. 270 tablet 3   lamoTRIgine (LAMICTAL) 100 MG tablet Take 0.5 tablets (50 mg total) by mouth at bedtime. 90 tablet 3   mupirocin ointment (BACTROBAN) 2 % Apply 1 application topically 2 (two) times daily. To ear (Patient not taking: Reported on 05/16/2021) 30 g 1   sulfamethoxazole-trimethoprim (SEPTRA DS) 800-160 MG per tablet Take 1 tablet by mouth 2 (two) times daily. (Patient not taking: Reported on 05/16/2021) 14 tablet 0   No current facility-administered medications for this visit.    Medication Side Effects: None  Allergies:  Allergies  Allergen Reactions   Red Dye     Past Medical History:  Diagnosis Date   Asthma     Family History  Problem Relation Age of Onset  Alcohol abuse Father    ADD / ADHD Sister    Depression Sister    ADD / ADHD Brother    Depression Maternal Uncle    Anxiety disorder Maternal Uncle    Bipolar disorder Maternal Uncle    Anxiety disorder Maternal Grandmother    Depression Maternal Grandmother    Bipolar disorder Maternal Grandmother     Social History   Socioeconomic History   Marital status: Single    Spouse name: Not on file   Number of children: Not on file   Years of  education: 13   Highest education level: Some college, no degree  Occupational History   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  Other Topics Concern   Not on file  Social History Narrative   Lives with mom and dad in [place]. Current Sophomore at [employer]. Major in Administrator, sports.  Like to paint in free time.    Social Determinants of Health   Financial Resource Strain: Not on file  Food Insecurity: Not on file  Transportation Needs: Not on file  Physical Activity: Not on file  Stress: Not on file  Social Connections: Not on file  Intimate Partner Violence: Not on file    Past Medical History, Surgical history, Social history, and Family history were reviewed and updated as appropriate.   Please see review of systems for further details on the patient's review from today.   Objective:   Physical Exam:  There were no vitals taken for this visit.  Physical Exam Psychiatric:        Attention and Perception: Attention and perception normal.        Mood and Affect: Mood and affect normal.        Speech: Speech normal.        Behavior: Behavior normal. Behavior is cooperative.        Cognition and Memory: Cognition and memory normal.        Judgment: Judgment normal.     Lab Review:  No results found for: "NA", "K", "CL", "CO2", "GLUCOSE", "BUN", "CREATININE", "CALCIUM", "PROT", "ALBUMIN", "AST", "ALT", "ALKPHOS", "BILITOT", "GFRNONAA", "GFRAA"     Component Value Date/Time   WBC 7.9 06/06/2007 2210   RBC 4.58 06/06/2007 2210   HGB 13.1 06/06/2007 2210   HCT 38.0 06/06/2007 2210   PLT  273 06/06/2007 2210   MCV 82.8 06/06/2007 2210   MCHC 34.5 (H) 06/06/2007 2210   RDW 12.7 06/06/2007 2210   LYMPHSABS 2.2 06/06/2007 2210   MONOABS 0.5 06/06/2007 2210   EOSABS 0.0 06/06/2007 2210   BASOSABS 0.1 06/06/2007 2210    No results found for: "POCLITH", "LITHIUM"   No results found for: "PHENYTOIN", "PHENOBARB", "VALPROATE", "CBMZ"   .res Assessment: Plan:    Greater than 50% of  20 min telephonic interview with patient was spent on counseling and coordination of care. We discussed her desire to increase her Adderall. She feels like she is seeing a mild decline recently. I discussed tolerance and recommended she take break from medication 1-2 days  per week.  We agree to: Continue  Lexapro to 20 mg daily. Continue  Lamictal down to 50 mg daily. To increase Adderall to 20 mg XR daily She will follow up in 3 months to reassess Provided emergency contact information Discussed potential benefits, risks, and side effects of stimulants with patient to include increased heart rate, palpitations, insomnia, increased anxiety, increased irritability, or decreased appetite.  Instructed patient to contact office if experiencing any significant tolerability issues.  Pt has IUD. Has sporadic short periods.  Reinforced that daily cannabis use could be complicating her treatment and suggested she stop or reduce frequency. Reviewed PDMP   There are no diagnoses linked to this encounter.  Please see After Visit Summary for patient specific instructions.  No future appointments.  No orders of the defined types were placed in this encounter.     -------------------------------

## 2022-12-15 ENCOUNTER — Other Ambulatory Visit: Payer: Self-pay | Admitting: Behavioral Health

## 2022-12-15 ENCOUNTER — Telehealth: Payer: Self-pay | Admitting: Behavioral Health

## 2022-12-15 DIAGNOSIS — F900 Attention-deficit hyperactivity disorder, predominantly inattentive type: Secondary | ICD-10-CM

## 2022-12-15 MED ORDER — AMPHETAMINE-DEXTROAMPHET ER 20 MG PO CP24: 20.0000 mg | ORAL_CAPSULE | Freq: Every day | ORAL | 0 refills | Status: DC

## 2022-12-15 NOTE — Telephone Encounter (Signed)
Pt called and said that at her visit brian increased her adderall xr 20 mg. [facility] [place]

## 2023-01-14 ENCOUNTER — Telehealth: Payer: Self-pay | Admitting: Behavioral Health

## 2023-01-14 ENCOUNTER — Other Ambulatory Visit: Payer: Self-pay | Admitting: Behavioral Health

## 2023-01-14 DIAGNOSIS — F900 Attention-deficit hyperactivity disorder, predominantly inattentive type: Secondary | ICD-10-CM

## 2023-01-14 MED ORDER — AMPHETAMINE-DEXTROAMPHET ER 20 MG PO CP24: 20.0000 mg | ORAL_CAPSULE | Freq: Every day | ORAL | 0 refills | Status: DC

## 2023-01-14 NOTE — Telephone Encounter (Signed)
Rhonda Campos called at 1:37 to request refill of her Adderall Xr 20mg    [facility] to [id number] [address] [name], [place] - [address]   Appt 03/16/23.

## 2023-02-17 ENCOUNTER — Telehealth: Payer: Self-pay | Admitting: Behavioral Health

## 2023-02-17 ENCOUNTER — Other Ambulatory Visit: Payer: Self-pay

## 2023-02-17 DIAGNOSIS — F900 Attention-deficit hyperactivity disorder, predominantly inattentive type: Secondary | ICD-10-CM

## 2023-02-17 MED ORDER — AMPHETAMINE-DEXTROAMPHET ER 20 MG PO CP24: 20.0000 mg | ORAL_CAPSULE | Freq: Every day | ORAL | 0 refills | Status: DC

## 2023-02-17 NOTE — Telephone Encounter (Signed)
Pended Rx. Will have letter at appt.

## 2023-02-17 NOTE — Telephone Encounter (Signed)
Pt requesting Rx for generic adderall XR 20 mg [facility] [place]. Apt 7/30. Also, requesting ESA letter for this year. All info same. Will pick up letter at apt on 7/30

## 2023-03-16 ENCOUNTER — Ambulatory Visit: Payer: BC Managed Care – PPO | Admitting: Behavioral Health

## 2023-03-19 ENCOUNTER — Encounter: Payer: Self-pay | Admitting: Behavioral Health

## 2023-03-19 ENCOUNTER — Ambulatory Visit: Payer: BC Managed Care – PPO | Admitting: Behavioral Health

## 2023-03-19 DIAGNOSIS — F341 Dysthymic disorder: Secondary | ICD-10-CM

## 2023-03-19 DIAGNOSIS — F900 Attention-deficit hyperactivity disorder, predominantly inattentive type: Secondary | ICD-10-CM

## 2023-03-19 MED ORDER — AMPHETAMINE-DEXTROAMPHET ER 20 MG PO CP24: 20.0000 mg | ORAL_CAPSULE | Freq: Every day | ORAL | 0 refills | Status: DC

## 2023-03-19 MED ORDER — LAMOTRIGINE 100 MG PO TABS: 50.0000 mg | ORAL_TABLET | Freq: Every day | ORAL | 3 refills | Status: DC

## 2023-03-19 NOTE — Progress Notes (Signed)
[facility]  Patient ID: Rhonda Campos,  MRN: [id number]  PCP: Maeola Harman, MD  Date of Evaluation: 03/19/2023 Time spent:20 minutes  Chief Complaint:  Chief Complaint   Anxiety; Depression; Follow-up; Medication Refill; Patient Education      HISTORY/CURRENT STATUS: HPI  22 year old female presents to this office for follow up and medication and medication management.  Has attempted to reduce her Lexapro and Lamictal but recently developed some emotional lability. Has had some crying spells. Says moods have been fluctuating more. Says she would like to increase her meds slightly again.  She says her anxiety today is 410 and depression is 4/10. She  does sleep 7-8 hours per night. She endorses daily Cannabis use. She denies mania, no psychosis. No SI/HI.  She would like to follow up in 3 months.    Past medication trial: Prozac     Individual Medical History/ Review of Systems: Changes? :No   Allergies: Red dye  Current Medications:  Current Outpatient Medications:    albuterol (PROVENTIL HFA;VENTOLIN HFA) 108 (90 BASE) MCG/ACT inhaler, Inhale into the lungs every 6 (six) hours as needed for wheezing or shortness of breath., Disp: , Rfl:    amphetamine-dextroamphetamine (ADDERALL XR) 15 MG 24 hr capsule, Take 1 capsule by mouth every morning., Disp: 30 capsule, Rfl: 0   amphetamine-dextroamphetamine (ADDERALL XR) 20 MG 24 hr capsule, Take 1 capsule (20 mg total) by mouth daily., Disp: 30 capsule, Rfl: 0   escitalopram (LEXAPRO) 10 MG tablet, Take 3 tablets (30 mg total) by mouth at bedtime., Disp: 270 tablet, Rfl: 3   lamoTRIgine (LAMICTAL) 100 MG tablet, Take 0.5 tablets (50 mg total) by mouth at bedtime., Disp: 90 tablet, Rfl: 3   mupirocin ointment (BACTROBAN) 2 %, Apply 1 application topically 2 (two) times daily. To ear (Patient not taking: Reported on 05/16/2021), Disp: 30 g, Rfl: 1   sulfamethoxazole-trimethoprim (SEPTRA DS) 800-160 MG per tablet, Take 1  tablet by mouth 2 (two) times daily. (Patient not taking: Reported on 05/16/2021), Disp: 14 tablet, Rfl: 0 Medication Side Effects: none  Family Medical/ Social History: Changes? No  MENTAL HEALTH EXAM:  Weight 143 lb (64.9 kg).Body mass index is 23.8 kg/m.  General Appearance: Casual, Neat, and Well Groomed  Eye Contact:  Good  Speech:  Clear and Coherent  Volume:  Normal  Mood:  NA  Affect:  Appropriate  Thought Process:  Coherent  Orientation:  Full (Time, Place, and Person)  Thought Content: Logical   Suicidal Thoughts:  No  Homicidal Thoughts:  No  Memory:  WNL  Judgement:  Good  Insight:  Good  Psychomotor Activity:  Normal  Concentration:  Concentration: Good  Recall:  Good  Fund of Knowledge: Good  Language: Good  Assets:  Desire for Improvement  ADL's:  Intact  Cognition: WNL  Prognosis:  Good    DIAGNOSES:    ICD-10-CM   1. 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 Attention deficit hyperactivity disorder (ADHD), predominantly inattentive type  F90.0 amphetamine-dextroamphetamine (ADDERALL XR) 20 MG 24 hr capsule      Receiving Psychotherapy: No    RECOMMENDATIONS:   Greater than 50% of  20 min face to face interview with patient was spent on counseling and coordination of care. Discussed her recent fluctuation with moods. She tried to reduce medications but having some relapse. We discussed her medication  and made recommendations.    We agree to: Continue  Lexapro to 15 mg daily. Continue  Lamictal 50 mg daily. To continue  Adderall to 20 mg XR daily She will follow up in 4 months to reassess Provided emergency contact information Discussed potential benefits, risks, and side effects of stimulants with patient to include increased heart rate, palpitations, insomnia, increased anxiety, increased irritability,  or decreased appetite.  Instructed patient to contact office if experiencing any significant tolerability issues.  Pt has IUD. Has sporadic short periods.  Reinforced that daily cannabis use could be complicating her treatment and suggested she stop or reduce frequency. Reviewed PDMP         Joan Flores, NP

## 2023-05-03 ENCOUNTER — Telehealth: Payer: Self-pay | Admitting: Behavioral Health

## 2023-05-03 ENCOUNTER — Other Ambulatory Visit: Payer: Self-pay

## 2023-05-03 DIAGNOSIS — F900 Attention-deficit hyperactivity disorder, predominantly inattentive type: Secondary | ICD-10-CM

## 2023-05-03 NOTE — Telephone Encounter (Signed)
Pt called at 4:59 to request a RF on Adderall XR 20mg . Pt is at school in [place], so send to: [id number] [name], [place] - [address] AT [facility]  [address] [place] [address]  Boneta Lucks in December.

## 2023-05-03 NOTE — Telephone Encounter (Signed)
peneded

## 2023-05-04 MED ORDER — AMPHETAMINE-DEXTROAMPHET ER 20 MG PO CP24: 20.0000 mg | ORAL_CAPSULE | Freq: Every day | ORAL | 0 refills | Status: DC

## 2023-05-04 NOTE — Telephone Encounter (Signed)
Rf was sent to wrong pharmacy. Please correct.

## 2023-05-05 NOTE — Telephone Encounter (Signed)
Pt called again and said that it needs to be sent asap to the [facility] [place]

## 2023-05-07 ENCOUNTER — Other Ambulatory Visit: Payer: Self-pay

## 2023-05-07 DIAGNOSIS — F900 Attention-deficit hyperactivity disorder, predominantly inattentive type: Secondary | ICD-10-CM

## 2023-05-07 MED ORDER — AMPHETAMINE-DEXTROAMPHET ER 20 MG PO CP24: 20.0000 mg | ORAL_CAPSULE | Freq: Every day | ORAL | 0 refills | Status: DC

## 2023-05-14 ENCOUNTER — Other Ambulatory Visit: Payer: Self-pay | Admitting: Behavioral Health

## 2023-05-14 DIAGNOSIS — F422 Mixed obsessional thoughts and acts: Secondary | ICD-10-CM

## 2023-05-14 DIAGNOSIS — F341 Dysthymic disorder: Secondary | ICD-10-CM

## 2023-05-14 DIAGNOSIS — F4010 Social phobia, unspecified: Secondary | ICD-10-CM

## 2023-05-15 NOTE — Telephone Encounter (Signed)
Call patient to verify dose, last note says 30 mg

## 2023-06-28 ENCOUNTER — Other Ambulatory Visit: Payer: Self-pay

## 2023-06-28 ENCOUNTER — Telehealth: Payer: Self-pay | Admitting: Behavioral Health

## 2023-06-28 DIAGNOSIS — F900 Attention-deficit hyperactivity disorder, predominantly inattentive type: Secondary | ICD-10-CM

## 2023-06-28 NOTE — Telephone Encounter (Signed)
Pended.

## 2023-06-28 NOTE — Telephone Encounter (Signed)
Rhonda Campos called at 1:45 to request reifll of her Adderall XR 20mg .  Appt 08/03/23.  Send to  [id number] [address] [name], [place] - [address] AT [address]

## 2023-06-28 NOTE — Telephone Encounter (Signed)
This is your patient.

## 2023-06-29 MED ORDER — AMPHETAMINE-DEXTROAMPHET ER 20 MG PO CP24: 20.0000 mg | ORAL_CAPSULE | Freq: Every day | ORAL | 0 refills | Status: DC

## 2023-07-29 ENCOUNTER — Telehealth: Payer: Self-pay | Admitting: Behavioral Health

## 2023-07-29 ENCOUNTER — Other Ambulatory Visit: Payer: Self-pay

## 2023-07-29 DIAGNOSIS — F900 Attention-deficit hyperactivity disorder, predominantly inattentive type: Secondary | ICD-10-CM

## 2023-07-29 MED ORDER — AMPHETAMINE-DEXTROAMPHET ER 20 MG PO CP24: 20.0000 mg | ORAL_CAPSULE | Freq: Every day | ORAL | 0 refills | Status: DC

## 2023-07-29 NOTE — Telephone Encounter (Signed)
P[facility] to [facility] [place].

## 2023-07-29 NOTE — Telephone Encounter (Signed)
Pt called requesting a refill on her adderall xr 20 mg. [facility]acy [facility] [address] [place]. Next appt  12/17

## 2023-08-03 ENCOUNTER — Encounter: Payer: Self-pay | Admitting: Behavioral Health

## 2023-08-03 ENCOUNTER — Ambulatory Visit: Payer: BC Managed Care – PPO | Admitting: Behavioral Health

## 2023-08-03 VITALS — Wt 139.0 lb

## 2023-08-03 DIAGNOSIS — F341 Dysthymic disorder: Secondary | ICD-10-CM | POA: Diagnosis not present

## 2023-08-03 DIAGNOSIS — F4010 Social phobia, unspecified: Secondary | ICD-10-CM | POA: Diagnosis not present

## 2023-08-03 DIAGNOSIS — F422 Mixed obsessional thoughts and acts: Secondary | ICD-10-CM

## 2023-08-03 DIAGNOSIS — F902 Attention-deficit hyperactivity disorder, combined type: Secondary | ICD-10-CM

## 2023-08-03 MED ORDER — LAMOTRIGINE 100 MG PO TABS: 50.0000 mg | ORAL_TABLET | Freq: Every day | ORAL | 2 refills | Status: DC

## 2023-08-03 MED ORDER — ESCITALOPRAM OXALATE 10 MG PO TABS: 10.0000 mg | ORAL_TABLET | Freq: Every day | ORAL | 1 refills | Status: DC

## 2023-08-03 NOTE — Progress Notes (Signed)
[facility]  Patient ID: Rhonda Campos,  MRN: [id number]  PCP: Maeola Harman, MD  Date of Evaluation: 08/03/2023 Time spent:30 minutes  Chief Complaint:  Chief Complaint   Anxiety; Depression; Follow-up; Patient Education; Medication Refill     HISTORY/CURRENT STATUS: HPI 22 year old female presents to this office for follow up and medication and medication management.  Doing well right now on medications.  On winter break from school. Still living and working in [place]. Requesting no medication changes this visit.She says her anxiety today is 3/10 and depression is 2/10. She  does sleep 7-8 hours per night. She endorses daily Cannabis use. She denies mania, no psychosis. No SI/HI.  She would like to follow up in 3 months.    Past medication trial: Prozac  Individual Medical History/ Review of Systems: Changes? :No   Allergies: Red dye #40 (allura red)  Current Medications:  Current Outpatient Medications:    albuterol (PROVENTIL HFA;VENTOLIN HFA) 108 (90 BASE) MCG/ACT inhaler, Inhale into the lungs every 6 (six) hours as needed for wheezing or shortness of breath., Disp: , Rfl:    amphetamine-dextroamphetamine (ADDERALL XR) 20 MG 24 hr capsule, Take 1 capsule (20 mg total) by mouth daily., Disp: 30 capsule, Rfl: 0   escitalopram (LEXAPRO) 10 MG tablet, Take 1 tablet (10 mg total) by mouth daily., Disp: 90 tablet, Rfl: 1   lamoTRIgine (LAMICTAL) 100 MG tablet, Take 0.5 tablets (50 mg total) by mouth at bedtime., Disp: 30 tablet, Rfl: 2   mupirocin ointment (BACTROBAN) 2 %, Apply 1 application topically 2 (two) times daily. To ear (Patient not taking: Reported on 05/16/2021), Disp: 30 g, Rfl: 1   sulfamethoxazole-trimethoprim (SEPTRA DS) 800-160 MG per tablet, Take 1 tablet by mouth 2 (two) times daily. (Patient not taking: Reported on 05/16/2021), Disp: 14 tablet, Rfl: 0 Medication Side Effects: none  Family Medical/ Social History: Changes? No  MENTAL HEALTH  EXAM:  Weight 139 lb (63 kg), last menstrual period 07/27/2023.Body mass index is 23.13 kg/m.  General Appearance: Casual and Well Groomed  Eye Contact:  Good  Speech:  Clear and Coherent  Volume:  Normal  Mood:  NA  Affect:  Appropriate  Thought Process:  Coherent  Orientation:  Full (Time, Place, and Person)  Thought Content: Logical   Suicidal Thoughts:  No  Homicidal Thoughts:  No  Memory:  WNL  Judgement:  Good  Insight:  Good  Psychomotor Activity:  Normal  Concentration:  Concentration: Good  Recall:  Good  Fund of Knowledge: Good  Language: Good  Assets:  Desire for Improvement  ADL's:  Intact  Cognition: WNL  Prognosis:  Good    DIAGNOSES:    ICD-10-CM   1. Attention deficit hyperactivity disorder (ADHD), combined type  F90.2     2. Mixed obsessional thoughts and acts  F42.2 escitalopram (LEXAPRO) 10 MG tablet    3. 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 Social anxiety disorder  F40.10 escitalopram (LEXAPRO) 10 MG tablet      Receiving Psychotherapy: No    RECOMMENDATIONS:   Greater than 50% of  20 min face to face interview with patient was spent on counseling and coordination of care. Discussed her recent stability. She is doing great right now.  Requesting no medication changes.      We agree to: Continue  Lexapro to 10 mg daily. Continue  Lamictal 50 mg daily. To continue  Adderall to 20 mg XR daily She will follow up in 5 months to reassess Provided emergency contact information Discussed potential benefits, risks, and side effects of stimulants with patient to include increased heart rate, palpitations, insomnia, increased anxiety, increased irritability, or decreased appetite.  Instructed patient to contact office if experiencing any significant tolerability  issues.  Pt taking Accutane for acne. Caution with other medications. Pt has IUD. Has sporadic short periods. [facility] last week. Reinforced that daily cannabis use could be complicating her treatment and suggested she stop or reduce frequency. Reviewed PDMP   Joan Flores, NP

## 2023-08-31 ENCOUNTER — Other Ambulatory Visit: Payer: Self-pay

## 2023-08-31 ENCOUNTER — Telehealth: Payer: Self-pay | Admitting: Behavioral Health

## 2023-08-31 DIAGNOSIS — F900 Attention-deficit hyperactivity disorder, predominantly inattentive type: Secondary | ICD-10-CM

## 2023-08-31 MED ORDER — AMPHETAMINE-DEXTROAMPHET ER 20 MG PO CP24: 20.0000 mg | ORAL_CAPSULE | Freq: Every day | ORAL | 0 refills | Status: DC

## 2023-08-31 NOTE — Telephone Encounter (Signed)
 Pended Adderall 20 XR to [facility] pharmacy.

## 2023-08-31 NOTE — Telephone Encounter (Signed)
 Pt LVM @ 9:23a requesting refill of Adderall to   [id number] [name], [place] - [address] AT [facility] [address] [place] [address] Phone: [phone]  Fax: [phone]    Next appt 6/2

## 2023-10-01 ENCOUNTER — Other Ambulatory Visit: Payer: Self-pay

## 2023-10-01 ENCOUNTER — Telehealth: Payer: Self-pay | Admitting: Behavioral Health

## 2023-10-01 DIAGNOSIS — F900 Attention-deficit hyperactivity disorder, predominantly inattentive type: Secondary | ICD-10-CM

## 2023-10-01 NOTE — Telephone Encounter (Signed)
LF 11/4

## 2023-10-01 NOTE — Telephone Encounter (Signed)
RF request for Adderall XR. [facility] Next appt 6/2 [id number] [address] [place], [place] - [address] AT [address]

## 2023-10-01 NOTE — Telephone Encounter (Signed)
Pended Adderall XR 30 to [facility].

## 2023-10-04 MED ORDER — AMPHETAMINE-DEXTROAMPHET ER 20 MG PO CP24: 20.0000 mg | ORAL_CAPSULE | Freq: Every day | ORAL | 0 refills | Status: DC

## 2023-11-11 ENCOUNTER — Other Ambulatory Visit: Payer: Self-pay

## 2023-11-11 ENCOUNTER — Telehealth: Payer: Self-pay | Admitting: Behavioral Health

## 2023-11-11 DIAGNOSIS — F900 Attention-deficit hyperactivity disorder, predominantly inattentive type: Secondary | ICD-10-CM

## 2023-11-11 NOTE — Telephone Encounter (Signed)
Pended Adderall 

## 2023-11-11 NOTE — Telephone Encounter (Signed)
 Mom called requesting refill on Adderal called to:  [id number] [address] [name], [place] - [address] AT [place] Phone: [phone]  Fax: [phone]

## 2023-11-12 MED ORDER — AMPHETAMINE-DEXTROAMPHET ER 20 MG PO CP24: 20.0000 mg | ORAL_CAPSULE | Freq: Every day | ORAL | 0 refills | Status: DC

## 2023-12-13 ENCOUNTER — Other Ambulatory Visit: Payer: Self-pay

## 2023-12-13 ENCOUNTER — Telehealth: Payer: Self-pay | Admitting: Behavioral Health

## 2023-12-13 DIAGNOSIS — F900 Attention-deficit hyperactivity disorder, predominantly inattentive type: Secondary | ICD-10-CM

## 2023-12-13 MED ORDER — AMPHETAMINE-DEXTROAMPHET ER 20 MG PO CP24: 20.0000 mg | ORAL_CAPSULE | Freq: Every day | ORAL | 0 refills | Status: DC

## 2023-12-13 NOTE — Telephone Encounter (Signed)
 Pended.

## 2023-12-13 NOTE — Telephone Encounter (Signed)
 Rhonda Campos called at 1:50 tor equest refill of her Adderall.  Appt 6/2.  Send to [id number] [address] [place], [place] - [address]

## 2024-01-14 ENCOUNTER — Other Ambulatory Visit: Payer: Self-pay | Admitting: Behavioral Health

## 2024-01-14 DIAGNOSIS — F422 Mixed obsessional thoughts and acts: Secondary | ICD-10-CM

## 2024-01-14 DIAGNOSIS — F341 Dysthymic disorder: Secondary | ICD-10-CM

## 2024-01-14 DIAGNOSIS — F4010 Social phobia, unspecified: Secondary | ICD-10-CM

## 2024-01-14 NOTE — Telephone Encounter (Signed)
 Pt called at 1:34p requesting refill of Adderall XR 20mg  to   [id number] [address] [place], [place] - [address] AT [facility] [address] [place] [address] Phone: [phone]  Fax: [phone]

## 2024-01-15 ENCOUNTER — Other Ambulatory Visit: Payer: Self-pay

## 2024-01-15 DIAGNOSIS — F900 Attention-deficit hyperactivity disorder, predominantly inattentive type: Secondary | ICD-10-CM

## 2024-01-17 ENCOUNTER — Ambulatory Visit: Payer: BC Managed Care – PPO | Admitting: Behavioral Health

## 2024-01-17 MED ORDER — AMPHETAMINE-DEXTROAMPHET ER 20 MG PO CP24: 20.0000 mg | ORAL_CAPSULE | Freq: Every day | ORAL | 0 refills | Status: DC

## 2024-01-17 NOTE — Progress Notes (Signed)
 Pt did not show for scheduled appt and did not provide 24 hour notice as required. Additional fees to be assessed.

## 2024-02-02 ENCOUNTER — Encounter: Payer: Self-pay | Admitting: Behavioral Health

## 2024-02-02 ENCOUNTER — Ambulatory Visit: Payer: Self-pay | Admitting: Behavioral Health

## 2024-02-02 DIAGNOSIS — F341 Dysthymic disorder: Secondary | ICD-10-CM

## 2024-02-02 DIAGNOSIS — F4010 Social phobia, unspecified: Secondary | ICD-10-CM

## 2024-02-02 DIAGNOSIS — F422 Mixed obsessional thoughts and acts: Secondary | ICD-10-CM

## 2024-02-02 DIAGNOSIS — F900 Attention-deficit hyperactivity disorder, predominantly inattentive type: Secondary | ICD-10-CM

## 2024-02-02 MED ORDER — ESCITALOPRAM OXALATE 10 MG PO TABS: 10.0000 mg | ORAL_TABLET | Freq: Every day | ORAL | 0 refills | Status: DC

## 2024-02-02 MED ORDER — AMPHETAMINE-DEXTROAMPHET ER 20 MG PO CP24: 20.0000 mg | ORAL_CAPSULE | Freq: Every day | ORAL | 0 refills | Status: DC

## 2024-02-02 MED ORDER — LAMOTRIGINE 100 MG PO TABS: 50.0000 mg | ORAL_TABLET | Freq: Every day | ORAL | 5 refills | Status: DC

## 2024-02-02 NOTE — Progress Notes (Signed)
 [facility]  Patient ID: Rhonda Campos,  MRN: [id number]  PCP: Quinlan, Aveline, MD  Date of Evaluation: 02/02/2024 Time spent:30 minutes  Chief Complaint:  Chief Complaint   Depression; Anxiety; ADHD; Follow-up; Medication Refill; Patient Education     HISTORY/CURRENT STATUS: HPI 23 year old female presents to this office for follow up and medication and medication management.  Doing well right now on medications.  Graduated from [employer]. Looking for jobs over the summer. Requesting no medication changes this visit.She says her anxiety today is 2/10 and depression is 2/10. She  does sleep 7-8 hours per night. She endorses daily Cannabis use. She denies mania, no psychosis. No SI/HI.  She would like to follow up in 3 months.    Past medication trial: Prozac Individual Medical History/ Review of Systems: Changes? :No   Allergies: Red dye #40 (allura red)  Current Medications:  Current Outpatient Medications:    albuterol (PROVENTIL HFA;VENTOLIN HFA) 108 (90 BASE) MCG/ACT inhaler, Inhale into the lungs every 6 (six) hours as needed for wheezing or shortness of breath., Disp: , Rfl:    amphetamine -dextroamphetamine (ADDERALL XR) 20 MG 24 hr capsule, Take 1 capsule (20 mg total) by mouth daily., Disp: 30 capsule, Rfl: 0   escitalopram  (LEXAPRO ) 10 MG tablet, Take 1 tablet (10 mg total) by mouth daily., Disp: 30 tablet, Rfl: 0   lamoTRIgine  (LAMICTAL ) 100 MG tablet, Take 0.5 tablets (50 mg total) by mouth at bedtime., Disp: 30 tablet, Rfl: 5   mupirocin  ointment (BACTROBAN ) 2 %, Apply 1 application topically 2 (two) times daily. To ear (Patient not taking: Reported on 05/16/2021), Disp: 30 g, Rfl: 1   sulfamethoxazole -trimethoprim  (SEPTRA  DS) 800-160 MG per tablet, Take 1 tablet by mouth 2 (two) times daily. (Patient not taking: Reported on 05/16/2021), Disp: 14 tablet, Rfl: 0 Medication Side Effects: none  Family Medical/ Social History: Changes? No  MENTAL HEALTH  EXAM:  There were no vitals taken for this visit.There is no height or weight on file to calculate BMI.  General Appearance: Casual, Neat, and Well Groomed  Eye Contact:  Good  Speech:  Clear and Coherent  Volume:  Normal  Mood:  NA  Affect:  Appropriate  Thought Process:  Coherent  Orientation:  Full (Time, Place, and Person)  Thought Content: Logical   Suicidal Thoughts:  No  Homicidal Thoughts:  No  Memory:  WNL  Judgement:  Good  Insight:  Good  Psychomotor Activity:  Normal  Concentration:  Concentration: Good  Recall:  Good  Fund of Knowledge: Good  Language: Good  Assets:  Desire for Improvement  ADL's:  Intact  Cognition: WNL  Prognosis:  Good    DIAGNOSES:    ICD-10-CM   1. 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 Mixed obsessional thoughts and acts  F42.2 escitalopram  (LEXAPRO ) 10 MG tablet    3. Social anxiety disorder  F40.10 escitalopram  (LEXAPRO ) 10 MG tablet    4. Attention deficit hyperactivity disorder (ADHD), predominantly inattentive type  F90.0 amphetamine -dextroamphetamine (ADDERALL XR) 20 MG 24 hr capsule      Receiving Psychotherapy: No    RECOMMENDATIONS:    Greater than 50% of  20 min face to face interview with patient was spent on counseling and coordination of care. Discussed her recent stability. She is doing great right now. Graduated  college. Taking some down time over the summer. No questions or concerns this visit.  Requesting no medication changes.      We agree to: Continue  Lexapro  to 10 mg daily. Continue  Lamictal  50 mg daily. To continue  Adderall to 20 mg XR daily She will follow up in 5 months to reassess Provided emergency contact information Discussed potential benefits, risks, and side effects of stimulants with patient to include  increased heart rate, palpitations, insomnia, increased anxiety, increased irritability, or decreased appetite.  Instructed patient to contact office if experiencing any significant tolerability issues.  Pt taking Accutane for acne. Caution with other medications. Pt has IUD. Has sporadic short periods. [facility] last week. Reinforced that daily cannabis use could be complicating her treatment and suggested she stop or reduce frequency. Reviewed PDMP    Lincoln Renshaw, NP

## 2024-02-16 ENCOUNTER — Telehealth: Payer: Self-pay | Admitting: Behavioral Health

## 2024-02-16 NOTE — Telephone Encounter (Signed)
 Pt called in needing a refill of Adderall sent to   [facility] [address] [place]   Next appt 9/9

## 2024-02-16 NOTE — Telephone Encounter (Signed)
 Pt already has a RF available at the pharmacy. Notified her.

## 2024-03-24 ENCOUNTER — Other Ambulatory Visit: Payer: Self-pay

## 2024-03-24 ENCOUNTER — Telehealth: Payer: Self-pay | Admitting: Behavioral Health

## 2024-03-24 DIAGNOSIS — F900 Attention-deficit hyperactivity disorder, predominantly inattentive type: Secondary | ICD-10-CM

## 2024-03-24 MED ORDER — AMPHETAMINE-DEXTROAMPHET ER 20 MG PO CP24: 20.0000 mg | ORAL_CAPSULE | Freq: Every day | ORAL | 0 refills | Status: DC

## 2024-03-24 NOTE — Telephone Encounter (Signed)
 Pended.

## 2024-03-24 NOTE — Telephone Encounter (Signed)
 Next appt is 04/25/24. Rhonda Campos is requesting a refill on her Adderall XR 20 mg called to:  [id number] - [place], [place] - [address]   Phone: [phone]  Fax: [phone]

## 2024-04-25 ENCOUNTER — Ambulatory Visit: Admitting: Behavioral Health

## 2024-04-25 ENCOUNTER — Encounter: Payer: Self-pay | Admitting: Behavioral Health

## 2024-04-25 DIAGNOSIS — F4010 Social phobia, unspecified: Secondary | ICD-10-CM | POA: Diagnosis not present

## 2024-04-25 DIAGNOSIS — F422 Mixed obsessional thoughts and acts: Secondary | ICD-10-CM | POA: Diagnosis not present

## 2024-04-25 DIAGNOSIS — F341 Dysthymic disorder: Secondary | ICD-10-CM

## 2024-04-25 DIAGNOSIS — F900 Attention-deficit hyperactivity disorder, predominantly inattentive type: Secondary | ICD-10-CM

## 2024-04-25 MED ORDER — ESCITALOPRAM OXALATE 10 MG PO TABS: 10.0000 mg | ORAL_TABLET | Freq: Every day | ORAL | 1 refills | Status: DC

## 2024-04-25 MED ORDER — AMPHETAMINE-DEXTROAMPHET ER 20 MG PO CP24: 20.0000 mg | ORAL_CAPSULE | Freq: Every day | ORAL | 0 refills | Status: DC

## 2024-04-25 MED ORDER — LAMOTRIGINE 100 MG PO TABS: 50.0000 mg | ORAL_TABLET | Freq: Every day | ORAL | 5 refills | Status: DC

## 2024-04-25 NOTE — Progress Notes (Signed)
 [facility]  Patient ID: Irja Wheless,  MRN: [id number]  PCP: Quinlan, Aveline, MD  Date of Evaluation: 04/25/2024 Time spent:30 minutes  Chief Complaint:  Chief Complaint   Depression; Anxiety; Follow-up; Medication Refill; Patient Education; ADHD     HISTORY/CURRENT STATUS: HPI 23 year old female presents to this office for follow up and medication and medication management.  Doing well right now on medications.  Graduated from [employer]. 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  Requesting no medication changes this visit.She says her anxiety today is 2/10 and depression is 2/10. She  does sleep 7-8 hours per night. She endorses daily Cannabis use. She denies mania, no psychosis. No SI/HI.  She would like to follow up in 4 months.    Past medication trial: Prozac Individual Medical History/ Review of Systems: Changes? :No   Allergies: Red dye #40 (allura red)  Current Medications:  Current Outpatient Medications:    albuterol (PROVENTIL HFA;VENTOLIN HFA) 108 (90 BASE) MCG/ACT inhaler, Inhale into the lungs every 6 (six) hours as needed for wheezing or shortness of breath., Disp: , Rfl:    amphetamine -dextroamphetamine (ADDERALL XR) 20 MG 24 hr capsule, Take 1 capsule (20 mg total) by mouth daily., Disp: 30 capsule, Rfl: 0   escitalopram  (LEXAPRO ) 10 MG tablet, Take 1 tablet (10 mg total) by mouth daily., Disp: 90 tablet, Rfl: 1   lamoTRIgine  (LAMICTAL ) 100 MG tablet, Take 0.5 tablets (50 mg total) by mouth at bedtime., Disp: 30 tablet, Rfl: 5   mupirocin  ointment (BACTROBAN ) 2 %, Apply 1 application topically 2 (two) times daily. To ear (Patient not taking: Reported on 05/16/2021), Disp: 30 g, Rfl: 1   sulfamethoxazole -trimethoprim  (SEPTRA  DS) 800-160 MG per tablet, Take 1 tablet by mouth 2 (two) times daily. (Patient not taking: Reported on 05/16/2021), Disp: 14 tablet, Rfl: 0 Medication Side Effects: none  Family Medical/ Social History: Changes?  No  MENTAL HEALTH EXAM:  There were no vitals taken for this visit.There is no height or weight on file to calculate BMI.  General Appearance: Casual, Neat, and Well Groomed  Eye Contact:  Good  Speech:  Clear and Coherent  Volume:  Normal  Mood:  NA  Affect:  Appropriate  Thought Process:  Coherent  Orientation:  Full (Time, Place, and Person)  Thought Content: Logical   Suicidal Thoughts:  No  Homicidal Thoughts:  No  Memory:  WNL  Judgement:  Good  Insight:  Good  Psychomotor Activity:  Normal  Concentration:  Concentration: Good  Recall:  Good  Fund of Knowledge: Good  Language: Good  Assets:  Desire for Improvement  ADL's:  Intact  Cognition: WNL  Prognosis:  Good    DIAGNOSES:    ICD-10-CM   1. 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 Mixed obsessional thoughts and acts  F42.2 escitalopram  (LEXAPRO ) 10 MG tablet    3. Social anxiety disorder  F40.10 escitalopram  (LEXAPRO ) 10 MG tablet    4. Attention deficit hyperactivity disorder (ADHD), predominantly inattentive type  F90.0 amphetamine -dextroamphetamine (ADDERALL XR) 20 MG 24 hr capsule      Receiving Psychotherapy: No    RECOMMENDATIONS:  Greater than 50% of  20 min face to face interview with patient was spent on counseling and coordination of care. Discussed her recent stability. She is doing great right  now.  Requesting no medication changes.      We agree to: Continue  Lexapro  to 10 mg daily. Continue  Lamictal  50 mg daily. To continue  Adderall to 20 mg XR daily She will follow up in 4 months to reassess Provided emergency contact information Discussed potential benefits, risks, and side effects of stimulants with patient to include increased heart rate, palpitations, insomnia, increased anxiety, increased  irritability, or decreased appetite.  Instructed patient to contact office if experiencing any significant tolerability issues.  Pt taking Accutane for acne. Caution with other medications. Pt has IUD. Has sporadic short periods. [facility] last week. Reinforced that daily cannabis use could be complicating her treatment and suggested she stop or reduce frequency. Reviewed PDMP         Redell DELENA Pizza, NP

## 2024-05-30 ENCOUNTER — Telehealth: Payer: Self-pay | Admitting: Behavioral Health

## 2024-05-30 ENCOUNTER — Other Ambulatory Visit: Payer: Self-pay

## 2024-05-30 DIAGNOSIS — F900 Attention-deficit hyperactivity disorder, predominantly inattentive type: Secondary | ICD-10-CM

## 2024-05-30 MED ORDER — AMPHETAMINE-DEXTROAMPHET ER 20 MG PO CP24: 20.0000 mg | ORAL_CAPSULE | Freq: Every day | ORAL | 0 refills | Status: AC

## 2024-05-30 NOTE — Telephone Encounter (Signed)
 Pt called requesting Rx Adderall XR 20 mg to  [facility] [address]   Irene 1/9

## 2024-05-30 NOTE — Telephone Encounter (Signed)
 Pended

## 2024-07-03 ENCOUNTER — Telehealth: Payer: Self-pay | Admitting: Behavioral Health

## 2024-07-03 ENCOUNTER — Other Ambulatory Visit: Payer: Self-pay

## 2024-07-03 DIAGNOSIS — F900 Attention-deficit hyperactivity disorder, predominantly inattentive type: Secondary | ICD-10-CM

## 2024-07-03 MED ORDER — AMPHETAMINE-DEXTROAMPHET ER 20 MG PO CP24: 20.0000 mg | ORAL_CAPSULE | Freq: Every day | ORAL | 0 refills | Status: AC

## 2024-07-03 MED ORDER — AMPHETAMINE-DEXTROAMPHET ER 20 MG PO CP24: 20.0000 mg | ORAL_CAPSULE | Freq: Every day | ORAL | 0 refills | Status: DC

## 2024-07-03 NOTE — Telephone Encounter (Signed)
Pended 2 RF.

## 2024-07-03 NOTE — Telephone Encounter (Signed)
 Pt called at 9:21a requesting refill of Adderall to   [id number] [place], [place] - [address] AT [facility] [address] [facility] [address] [facility] [address] [place] [address] Phone: [phone]  Fax: [phone]   Next appt 1/9

## 2024-08-25 ENCOUNTER — Ambulatory Visit (INDEPENDENT_AMBULATORY_CARE_PROVIDER_SITE_OTHER): Admitting: Behavioral Health

## 2024-08-25 ENCOUNTER — Encounter: Payer: Self-pay | Admitting: Behavioral Health

## 2024-08-25 VITALS — Ht 65.0 in | Wt 142.0 lb

## 2024-08-25 DIAGNOSIS — F900 Attention-deficit hyperactivity disorder, predominantly inattentive type: Secondary | ICD-10-CM | POA: Diagnosis not present

## 2024-08-25 DIAGNOSIS — F422 Mixed obsessional thoughts and acts: Secondary | ICD-10-CM

## 2024-08-25 DIAGNOSIS — F4010 Social phobia, unspecified: Secondary | ICD-10-CM | POA: Diagnosis not present

## 2024-08-25 DIAGNOSIS — F341 Dysthymic disorder: Secondary | ICD-10-CM | POA: Diagnosis not present

## 2024-08-25 MED ORDER — ESCITALOPRAM OXALATE 10 MG PO TABS: 10.0000 mg | ORAL_TABLET | Freq: Every day | ORAL | 1 refills | Status: AC

## 2024-08-25 MED ORDER — AMPHETAMINE-DEXTROAMPHET ER 20 MG PO CP24: 20.0000 mg | ORAL_CAPSULE | Freq: Every day | ORAL | 0 refills | Status: AC

## 2024-08-25 MED ORDER — LAMOTRIGINE 100 MG PO TABS: 50.0000 mg | ORAL_TABLET | Freq: Every day | ORAL | 5 refills | Status: AC

## 2024-08-25 NOTE — Progress Notes (Signed)
 "     [facility]  Patient ID: Rhonda Campos,  MRN: [id number]  PCP: Quinlan, Aveline, MD  Date of Evaluation: 08/25/2024 Time spent:30 minutes  Chief Complaint:  Chief Complaint   ADHD; Follow-up; Anxiety; Depression; Patient Education; Medication Refill     HISTORY/CURRENT STATUS: HPI Rhonda Campos 24 year old female presents to this office for follow up and medication and medication management.  Doing well right now on medications.  Reports that she recently obtained a new job more aligned with her degree.  Recently engaged in October and will be getting married this October.  Requesting no medication changes this visit.She says her anxiety today is 2/10 and depression is 2/10. She  does sleep 7-8 hours per night. She endorses daily Cannabis use. She denies mania, no psychosis. No SI/HI.  She would like to follow up in 4 months.    Past medication trial: Prozac Individual Medical History/ Review of Systems: Changes? :No   Allergies: Red dye #40 (allura red)  Current Medications: Current Medications[1] Medication Side Effects: none  Family Medical/ Social History: Changes? No  MENTAL HEALTH EXAM:  Height 5' 5 (1.651 m), weight 142 lb (64.4 kg), last menstrual period 08/12/2024.Body mass index is 23.63 kg/m.  General Appearance: Casual, Neat, and Well Groomed  Eye Contact:  Good  Speech:  Clear and Coherent  Volume:  Normal  Mood:  NA  Affect:  Appropriate  Thought Process:  Coherent  Orientation:  Full (Time, Place, and Person)  Thought Content: Logical   Suicidal Thoughts:  No  Homicidal Thoughts:  No  Memory:  WNL  Judgement:  Good  Insight:  Good  Psychomotor Activity:  Normal  Concentration:  Concentration: Good  Recall:  Good  Fund of Knowledge: Good  Language: Good  Assets:  Desire for Improvement  ADL's:  Intact  Cognition: WNL  Prognosis:  Good    DIAGNOSES:    ICD-10-CM   1. Attention deficit hyperactivity disorder (ADHD), predominantly  inattentive type  F90.0 amphetamine -dextroamphetamine (ADDERALL XR) 20 MG 24 hr capsule    2. Moderate early onset persistent depressive disorder in partial remission with atypical features and pure persistent depressive syndrome  F34.1 lamoTRIgine  (LAMICTAL ) 100 MG tablet    escitalopram  (LEXAPRO ) 10 MG tablet    3. Mixed obsessional thoughts and acts  F42.2 escitalopram  (LEXAPRO ) 10 MG tablet    4. Social anxiety disorder  F40.10 escitalopram  (LEXAPRO ) 10 MG tablet      Receiving Psychotherapy: No    RECOMMENDATIONS:   Greater than 50% of  20 min face to face interview with patient was spent on counseling and coordination of care. Discussed her recent stability. She is doing great right now.  Requesting no medication changes.      We agree to:  Continue  Lexapro  to 10 mg daily. Continue  Lamictal  50 mg daily. To continue  Adderall to 20 mg XR daily She will follow up in 6 months to reassess Provided emergency contact information Discussed potential benefits, risks, and side effects of stimulants with patient to include increased heart rate, palpitations, insomnia, increased anxiety, increased irritability, or decreased appetite.  Instructed patient to contact office if experiencing any significant tolerability issues.  Finished regimen of Accutane.  Skin on face is much improved. Pt has IUD. Has sporadic short periods. [facility] 08/12/2024. Reinforced that daily cannabis use could be complicating her treatment and suggested she stop or reduce frequency. Reviewed PDMP       Rhonda DELENA Pizza, NP     [  1]  Current Outpatient Medications:    albuterol (PROVENTIL HFA;VENTOLIN HFA) 108 (90 BASE) MCG/ACT inhaler, Inhale into the lungs every 6 (six) hours as needed for wheezing or shortness of breath., Disp: , Rfl:    amphetamine -dextroamphetamine (ADDERALL XR) 20 MG 24 hr capsule, Take 1 capsule (20 mg total) by mouth daily., Disp: 30 capsule, Rfl: 0   amphetamine -dextroamphetamine  (ADDERALL XR) 20 MG 24 hr capsule, Take 1 capsule (20 mg total) by mouth daily., Disp: 30 capsule, Rfl: 0   [START ON 08/30/2024] amphetamine -dextroamphetamine (ADDERALL XR) 20 MG 24 hr capsule, Take 1 capsule (20 mg total) by mouth daily., Disp: 30 capsule, Rfl: 0   escitalopram  (LEXAPRO ) 10 MG tablet, Take 1 tablet (10 mg total) by mouth daily., Disp: 90 tablet, Rfl: 1   lamoTRIgine  (LAMICTAL ) 100 MG tablet, Take 0.5 tablets (50 mg total) by mouth at bedtime., Disp: 30 tablet, Rfl: 5   mupirocin  ointment (BACTROBAN ) 2 %, Apply 1 application topically 2 (two) times daily. To ear (Patient not taking: Reported on 05/16/2021), Disp: 30 g, Rfl: 1   sulfamethoxazole -trimethoprim  (SEPTRA  DS) 800-160 MG per tablet, Take 1 tablet by mouth 2 (two) times daily. (Patient not taking: Reported on 05/16/2021), Disp: 14 tablet, Rfl: 0  "

## 2025-02-23 ENCOUNTER — Ambulatory Visit: Admitting: Behavioral Health
